# Patient Record
Sex: Female | Born: 1935 | Race: White | Hispanic: No | Smoking: Never smoker
Health system: Southern US, Community
[De-identification: ages and names within clinical notes are randomized; demographics above are authoritative.]

## PROBLEM LIST (undated history)

## (undated) DIAGNOSIS — I4891 Unspecified atrial fibrillation: Secondary | ICD-10-CM

## (undated) DIAGNOSIS — I10 Essential (primary) hypertension: Secondary | ICD-10-CM

## (undated) DIAGNOSIS — M1990 Unspecified osteoarthritis, unspecified site: Secondary | ICD-10-CM

## (undated) DIAGNOSIS — I6529 Occlusion and stenosis of unspecified carotid artery: Secondary | ICD-10-CM

## (undated) DIAGNOSIS — I351 Nonrheumatic aortic (valve) insufficiency: Secondary | ICD-10-CM

## (undated) DIAGNOSIS — F0390 Unspecified dementia without behavioral disturbance: Secondary | ICD-10-CM

## (undated) DIAGNOSIS — E785 Hyperlipidemia, unspecified: Secondary | ICD-10-CM

## (undated) DIAGNOSIS — I639 Cerebral infarction, unspecified: Secondary | ICD-10-CM

## (undated) HISTORY — PX: PACEMAKER INSERTION: SHX728

---

## 2000-06-20 ENCOUNTER — Encounter: Admission: RE | Admit: 2000-06-20 | Discharge: 2000-06-20 | Payer: Self-pay | Admitting: Internal Medicine

## 2000-06-20 ENCOUNTER — Encounter: Payer: Self-pay | Admitting: Internal Medicine

## 2001-04-05 ENCOUNTER — Encounter (HOSPITAL_BASED_OUTPATIENT_CLINIC_OR_DEPARTMENT_OTHER): Payer: Self-pay | Admitting: Internal Medicine

## 2001-04-05 ENCOUNTER — Encounter: Admission: RE | Admit: 2001-04-05 | Discharge: 2001-04-05 | Payer: Self-pay | Admitting: Internal Medicine

## 2001-07-02 ENCOUNTER — Encounter (HOSPITAL_BASED_OUTPATIENT_CLINIC_OR_DEPARTMENT_OTHER): Payer: Self-pay | Admitting: Internal Medicine

## 2001-07-02 ENCOUNTER — Encounter: Admission: RE | Admit: 2001-07-02 | Discharge: 2001-07-02 | Payer: Self-pay | Admitting: Internal Medicine

## 2012-08-01 ENCOUNTER — Emergency Department (HOSPITAL_BASED_OUTPATIENT_CLINIC_OR_DEPARTMENT_OTHER): Payer: Medicare Other

## 2012-08-01 ENCOUNTER — Encounter (HOSPITAL_BASED_OUTPATIENT_CLINIC_OR_DEPARTMENT_OTHER): Payer: Self-pay

## 2012-08-01 ENCOUNTER — Inpatient Hospital Stay (HOSPITAL_BASED_OUTPATIENT_CLINIC_OR_DEPARTMENT_OTHER)
Admission: EM | Admit: 2012-08-01 | Discharge: 2012-08-09 | DRG: 308 | Disposition: A | Discharge status: Skilled Nursing Facility | Payer: Medicare Other | Attending: Internal Medicine | Admitting: Internal Medicine

## 2012-08-01 DIAGNOSIS — J9600 Acute respiratory failure, unspecified whether with hypoxia or hypercapnia: Secondary | ICD-10-CM | POA: Diagnosis present

## 2012-08-01 DIAGNOSIS — E876 Hypokalemia: Secondary | ICD-10-CM

## 2012-08-01 DIAGNOSIS — Z8673 Personal history of transient ischemic attack (TIA), and cerebral infarction without residual deficits: Secondary | ICD-10-CM

## 2012-08-01 DIAGNOSIS — J811 Chronic pulmonary edema: Secondary | ICD-10-CM

## 2012-08-01 DIAGNOSIS — R040 Epistaxis: Secondary | ICD-10-CM

## 2012-08-01 DIAGNOSIS — I4891 Unspecified atrial fibrillation: Principal | ICD-10-CM

## 2012-08-01 DIAGNOSIS — Z66 Do not resuscitate: Secondary | ICD-10-CM | POA: Diagnosis present

## 2012-08-01 DIAGNOSIS — F0390 Unspecified dementia without behavioral disturbance: Secondary | ICD-10-CM | POA: Diagnosis present

## 2012-08-01 DIAGNOSIS — Z950 Presence of cardiac pacemaker: Secondary | ICD-10-CM

## 2012-08-01 DIAGNOSIS — I10 Essential (primary) hypertension: Secondary | ICD-10-CM | POA: Diagnosis present

## 2012-08-01 DIAGNOSIS — Z515 Encounter for palliative care: Secondary | ICD-10-CM

## 2012-08-01 DIAGNOSIS — J189 Pneumonia, unspecified organism: Secondary | ICD-10-CM

## 2012-08-01 DIAGNOSIS — E8779 Other fluid overload: Secondary | ICD-10-CM | POA: Diagnosis present

## 2012-08-01 HISTORY — DX: Essential (primary) hypertension: I10

## 2012-08-01 HISTORY — DX: Cerebral infarction, unspecified: I63.9

## 2012-08-01 HISTORY — DX: Unspecified atrial fibrillation: I48.91

## 2012-08-01 LAB — CBC
HCT: 36.9 % (ref 36.0–46.0)
Hemoglobin: 12.8 g/dL (ref 12.0–15.0)
MCH: 31.6 pg (ref 26.0–34.0)
MCHC: 34.7 g/dL (ref 30.0–36.0)
MCV: 91.1 fL (ref 78.0–100.0)
Platelets: 280 10*3/uL (ref 150–400)
RBC: 4.05 MIL/uL (ref 3.87–5.11)
RDW: 14.4 % (ref 11.5–15.5)
WBC: 15.2 10*3/uL — ABNORMAL HIGH (ref 4.0–10.5)

## 2012-08-01 LAB — BASIC METABOLIC PANEL
BUN: 59 mg/dL — ABNORMAL HIGH (ref 6–23)
CO2: 19 mEq/L (ref 19–32)
Calcium: 10.1 mg/dL (ref 8.4–10.5)
Chloride: 102 mEq/L (ref 96–112)
Creatinine, Ser: 1.1 mg/dL (ref 0.50–1.10)
GFR calc Af Amer: 46 mL/min — ABNORMAL LOW (ref >90)
GFR calc non Af Amer: 40 mL/min — ABNORMAL LOW (ref >90)
Glucose, Bld: 174 mg/dL — ABNORMAL HIGH (ref 70–99)
Potassium: 4 mEq/L (ref 3.5–5.1)
Sodium: 139 mEq/L (ref 135–145)

## 2012-08-01 LAB — TROPONIN I: Troponin I: <0.3 ng/mL (ref <0.30)

## 2012-08-01 MED ORDER — DILTIAZEM HCL 100 MG IV SOLR
INTRAVENOUS | Status: AC
  Filled 2012-08-01: qty 100

## 2012-08-01 MED ORDER — SODIUM CHLORIDE 0.9 % IV SOLN
Freq: Once | INTRAVENOUS | Status: AC
  Administered 2012-08-01: 22:00:00 via INTRAVENOUS

## 2012-08-01 MED ORDER — DILTIAZEM HCL 25 MG/5ML IV SOLN
10.0000 mg | Freq: Once | INTRAVENOUS | Status: AC
  Administered 2012-08-01: 10 mg via INTRAVENOUS

## 2012-08-01 MED ORDER — DILTIAZEM HCL 100 MG IV SOLR
5.0000 mg/h | Freq: Once | INTRAVENOUS | Status: AC
  Administered 2012-08-01: 5 mg/h via INTRAVENOUS

## 2012-08-01 NOTE — Procedures (Signed)
Arrived to triage with a 79yo female with complaint of SOB. Patient was tachy with RR of 44. Patient O2 saturations of 77. Patient was then transported immediately to another room and placed on a 50% venti-mask with oxygen saturations of 90-93%. Patient BD ess. Clear per auscultation. Patient color improved. Will continue to monitor and treat according.

## 2012-08-01 NOTE — ED Notes (Addendum)
Grandson reports pt has been weak, SOB-was seen by Cards yesterday-dx with Afib with elevated HR

## 2012-08-01 NOTE — ED Notes (Signed)
MD at bedside. 

## 2012-08-01 NOTE — ED Provider Notes (Addendum)
History     CSN: [id number]  Arrival date & time 08/01/12  2103   First MD Initiated Contact with Patient 08/01/12 2123      Chief Complaint  Patient presents with  . Weakness    (Consider location/radiation/quality/duration/timing/severity/associated sxs/prior treatment) Patient is a 79 y.o. female presenting with weakness.  Weakness   Level 5 caveat due to urgent need for intervention Pt with history of afib diagnosed recently at cardiology office had her metoprolol increased however today she began to have general weakness and SOB. Denies any chest pain, brought to the ED by family members where she was found to be hypoxic in the 70s and tachycardic in the 140s.   Past Medical History  Diagnosis Date  . Stroke   . A-fib     Past Surgical History  Procedure Laterality Date  . Pacemaker insertion      No family history on file.  History  Substance Use Topics  . Smoking status: Never Smoker   . Smokeless tobacco: Not on file  . Alcohol Use: No    OB History   Grav Para Term Preterm Abortions TAB SAB Ect Mult Living                  Review of Systems  Neurological: Positive for weakness.   Unable to assess due to urgent need for intervention  Allergies  Review of patient's allergies indicates no known allergies.  Home Medications   Current Outpatient Rx  Name  Route  Sig  Dispense  Refill  . amLODipine (NORVASC) 5 MG tablet   Oral   Take 5 mg by mouth daily.         . clonazePAM (KLONOPIN) 0.5 MG tablet   Oral   Take 0.5 mg by mouth 2 (two) times daily as needed for anxiety.         . metoprolol succinate (TOPROL XL) 50 MG 24 hr tablet   Oral   Take 50 mg by mouth 2 (two) times daily. Take with or immediately following a meal.           BP 113/66  Pulse 144  Resp 18  SpO2 91%  Physical Exam  Nursing note and vitals reviewed. Constitutional: She is oriented to person, place, and time. She appears well-developed and well-nourished.   HENT:  Head: Normocephalic and atraumatic.  Eyes: EOM are normal. Pupils are equal, round, and reactive to light.  Neck: Normal range of motion. Neck supple.  Cardiovascular: Normal heart sounds and intact distal pulses.  An irregular rhythm present. Tachycardia present.   Pulmonary/Chest: Tachypnea noted.  Abdominal: Bowel sounds are normal. She exhibits no distension. There is no tenderness.  Musculoskeletal: Normal range of motion. She exhibits no edema and no tenderness.  Neurological: She is alert and oriented to person, place, and time. She has normal strength. No cranial nerve deficit or sensory deficit.  Skin: Skin is warm and dry. No rash noted.  Psychiatric: She has a normal mood and affect.    ED Course  Procedures (including critical care time)  CRITICAL CARE Performed by: Pollyann Savoy. Total critical care time: 45 Critical care time was exclusive of separately billable procedures and treating other patients. Critical care was necessary to treat or prevent imminent or life-threatening deterioration. Critical care was time spent personally by me on the following activities: development of treatment plan with patient and/or surrogate as well as nursing, discussions with consultants, evaluation of patient's response to treatment, examination of  patient, obtaining history from patient or surrogate, ordering and performing treatments and interventions, ordering and review of laboratory studies, ordering and review of radiographic studies, pulse oximetry and re-evaluation of patient's condition.   Labs Reviewed  CBC - Abnormal; Notable for the following:    WBC 15.2 (*)    All other components within normal limits  BASIC METABOLIC PANEL - Abnormal; Notable for the following:    Glucose, Bld 174 (*)    BUN 59 (*)    GFR calc non Af Amer 40 (*)    GFR calc Af Amer 46 (*)    All other components within normal limits  TROPONIN I   Dg Chest Port 1 View  08/01/2012    *RADIOLOGY REPORT*  Clinical Data: Chest pain and shortness of breath.  PORTABLE CHEST - 1 VIEW  Comparison: None.  Findings: The lungs are well-aerated.  Vascular congestion is noted, with diffusely increased interstitial markings and left basilar airspace opacity.  A small left pleural effusion is suspected.  Findings are concerning for pulmonary edema.  There is no evidence of pneumothorax.  The cardiomediastinal silhouette is borderline normal in size. Calcification is noted in the aortic arch.  A pacemaker is seen overlying the left chest wall, with leads ending overlying the right atrium and right ventricle.  No acute osseous abnormalities are seen.  IMPRESSION: Vascular congestion, with diffusely increased interstitial markings and left basilar airspace opacity.  Suspect small left pleural effusion.  Findings concerning for pulmonary edema.   Original Report Authenticated By: Tonia Ghent, M.D.     1. Atrial fibrillation with RVR   2. Pulmonary edema       MDM   Date: 08/01/2012  Rate: 145  Rhythm: atrial fibrillation and with RVR  QRS Axis: left  Intervals: normal  ST/T Wave abnormalities: nonspecific ST/T changes  Conduction Disutrbances:none  Narrative Interpretation:   Old EKG Reviewed: none available  Pt BP is good, started on cardizem after bolus with minimal improvement. Drip was titrated and additional bolus given with good response, now HR in the 100s, still in afib. CXR shows some pulmonary edema, likely related to rate. SpO2 is 90-92% on 50% NRB. She is DNR/DNI per grandson at bedside who is POA. Her cardiologist is at [facility] however their hospital is full and they are not accepting transfers tonight. Discussed with Dr. Julian Reil on call for Hospitalist at [facility] who will accept her for transfer and further evaluation.        Emalina Dubreuil B. Bernette Mayers, MD 08/01/12 2339  Bonnita Levan. Bernette Mayers, MD 08/01/12 2351

## 2012-08-01 NOTE — ED Notes (Signed)
Pt found to have low sats, irregular HR and elevated HR-plae-dry to touch-taken to tx room 14-pt was able to stand was lifted and placed on stretcher by staff x 2

## 2012-08-01 NOTE — ED Notes (Signed)
Grandson reports pt with increased weakness and mild confusion today. Pt has oxygen sat low 80's on arrival to ED. Pt in AFib on monitor and placed on oxygen by RT.

## 2012-08-02 ENCOUNTER — Inpatient Hospital Stay (HOSPITAL_COMMUNITY): Payer: Medicare Other

## 2012-08-02 ENCOUNTER — Encounter (HOSPITAL_BASED_OUTPATIENT_CLINIC_OR_DEPARTMENT_OTHER): Payer: Self-pay | Admitting: Emergency Medicine

## 2012-08-02 DIAGNOSIS — J811 Chronic pulmonary edema: Secondary | ICD-10-CM | POA: Diagnosis present

## 2012-08-02 DIAGNOSIS — J189 Pneumonia, unspecified organism: Secondary | ICD-10-CM | POA: Diagnosis present

## 2012-08-02 DIAGNOSIS — I059 Rheumatic mitral valve disease, unspecified: Secondary | ICD-10-CM

## 2012-08-02 DIAGNOSIS — I4891 Unspecified atrial fibrillation: Secondary | ICD-10-CM | POA: Diagnosis present

## 2012-08-02 LAB — BLOOD GAS, ARTERIAL
Acid-base deficit: 4.2 mmol/L — ABNORMAL HIGH (ref 0.0–2.0)
Drawn by: 34767
FIO2: 1 %
TCO2: 19.6 mmol/L (ref 0–100)
pCO2 arterial: 25.9 mmHg — ABNORMAL LOW (ref 35.0–45.0)
pH, Arterial: 7.474 — ABNORMAL HIGH (ref 7.350–7.450)
pO2, Arterial: 97.6 mmHg (ref 80.0–100.0)

## 2012-08-02 LAB — MRSA PCR SCREENING: MRSA by PCR: NEGATIVE

## 2012-08-02 LAB — CBC WITH DIFFERENTIAL/PLATELET
Basophils Absolute: 0 10*3/uL (ref 0.0–0.1)
Basophils Relative: 0 % (ref 0–1)
HCT: 34.9 % — ABNORMAL LOW (ref 36.0–46.0)
Lymphocytes Relative: 12 % (ref 12–46)
MCHC: 34.7 g/dL (ref 30.0–36.0)
Monocytes Absolute: 1 10*3/uL (ref 0.1–1.0)
Neutro Abs: 11.3 10*3/uL — ABNORMAL HIGH (ref 1.7–7.7)
Neutrophils Relative %: 81 % — ABNORMAL HIGH (ref 43–77)
Platelets: 245 10*3/uL (ref 150–400)
RDW: 14.8 % (ref 11.5–15.5)
WBC: 14.1 10*3/uL — ABNORMAL HIGH (ref 4.0–10.5)

## 2012-08-02 LAB — COMPREHENSIVE METABOLIC PANEL
ALT: 81 U/L — ABNORMAL HIGH (ref 0–35)
Albumin: 3.3 g/dL — ABNORMAL LOW (ref 3.5–5.2)
Alkaline Phosphatase: 138 U/L — ABNORMAL HIGH (ref 39–117)
Chloride: 103 mEq/L (ref 96–112)
Glucose, Bld: 156 mg/dL — ABNORMAL HIGH (ref 70–99)
Potassium: 3.9 mEq/L (ref 3.5–5.1)
Sodium: 138 mEq/L (ref 135–145)
Total Bilirubin: 1.3 mg/dL — ABNORMAL HIGH (ref 0.3–1.2)
Total Protein: 7.2 g/dL (ref 6.0–8.3)

## 2012-08-02 LAB — URINALYSIS, ROUTINE W REFLEX MICROSCOPIC
Bilirubin Urine: NEGATIVE
Glucose, UA: NEGATIVE mg/dL
Hgb urine dipstick: NEGATIVE
Ketones, ur: 15 mg/dL — AB
Nitrite: NEGATIVE
Specific Gravity, Urine: 1.025 (ref 1.005–1.030)
pH: 5.5 (ref 5.0–8.0)

## 2012-08-02 LAB — D-DIMER, QUANTITATIVE: D-Dimer, Quant: 2.83 ug/mL-FEU — ABNORMAL HIGH (ref 0.00–0.48)

## 2012-08-02 LAB — URINE MICROSCOPIC-ADD ON

## 2012-08-02 LAB — TROPONIN I
Troponin I: <0.3 ng/mL (ref <0.30)
Troponin I: <0.3 ng/mL (ref <0.30)

## 2012-08-02 LAB — STREP PNEUMONIAE URINARY ANTIGEN: Strep Pneumo Urinary Antigen: NEGATIVE

## 2012-08-02 MED ORDER — DEXTROSE 5 % IV SOLN
500.0000 mg | Freq: Every day | INTRAVENOUS | Status: DC
  Administered 2012-08-02 – 2012-08-04 (×3): 500 mg via INTRAVENOUS
  Filled 2012-08-02 (×3): qty 500

## 2012-08-02 MED ORDER — DEXTROSE 5 % IV SOLN
1.0000 g | INTRAVENOUS | Status: DC
  Administered 2012-08-02 – 2012-08-07 (×6): 1 g via INTRAVENOUS
  Filled 2012-08-02 (×7): qty 10

## 2012-08-02 MED ORDER — DILTIAZEM HCL 100 MG IV SOLR
INTRAVENOUS | Status: AC
  Filled 2012-08-02: qty 100

## 2012-08-02 MED ORDER — ASPIRIN EC 81 MG PO TBEC
81.0000 mg | DELAYED_RELEASE_TABLET | Freq: Every day | ORAL | Status: DC
  Administered 2012-08-03 – 2012-08-08 (×6): 81 mg via ORAL
  Filled 2012-08-02 (×8): qty 1

## 2012-08-02 MED ORDER — DILTIAZEM HCL 100 MG IV SOLR
5.0000 mg/h | INTRAVENOUS | Status: DC
  Administered 2012-08-02: 15 mg/h via INTRAVENOUS
  Administered 2012-08-02: 10 mg/h via INTRAVENOUS
  Administered 2012-08-02 – 2012-08-05 (×10): 15 mg/h via INTRAVENOUS
  Filled 2012-08-02 (×2): qty 100

## 2012-08-02 MED ORDER — ENOXAPARIN SODIUM 60 MG/0.6ML ~~LOC~~ SOLN
50.0000 mg | SUBCUTANEOUS | Status: DC
  Administered 2012-08-03 – 2012-08-05 (×3): 50 mg via SUBCUTANEOUS
  Filled 2012-08-02 (×3): qty 0.6

## 2012-08-02 MED ORDER — ENOXAPARIN SODIUM 30 MG/0.3ML ~~LOC~~ SOLN
20.0000 mg | SUBCUTANEOUS | Status: AC
  Administered 2012-08-02: 20 mg via SUBCUTANEOUS
  Filled 2012-08-02: qty 0.2

## 2012-08-02 MED ORDER — SODIUM CHLORIDE 0.9 % IJ SOLN: 3.0000 mL | INTRAMUSCULAR | Status: DC | PRN

## 2012-08-02 MED ORDER — PANTOPRAZOLE SODIUM 40 MG PO TBEC: 40.0000 mg | DELAYED_RELEASE_TABLET | Freq: Every day | ORAL | Status: DC

## 2012-08-02 MED ORDER — VANCOMYCIN HCL 500 MG IV SOLR: 500.0000 mg | INTRAVENOUS | Status: DC

## 2012-08-02 MED ORDER — ASPIRIN 325 MG PO TABS
325.0000 mg | ORAL_TABLET | Freq: Every day | ORAL | Status: DC
  Administered 2012-08-02: 325 mg via ORAL
  Filled 2012-08-02: qty 1

## 2012-08-02 MED ORDER — VANCOMYCIN HCL IN DEXTROSE 1-5 GM/200ML-% IV SOLN
1000.0000 mg | Freq: Once | INTRAVENOUS | Status: AC
  Administered 2012-08-02: 1000 mg via INTRAVENOUS
  Filled 2012-08-02: qty 200

## 2012-08-02 MED ORDER — ENOXAPARIN SODIUM 30 MG/0.3ML ~~LOC~~ SOLN
30.0000 mg | Freq: Every day | SUBCUTANEOUS | Status: DC
  Administered 2012-08-02: 30 mg via SUBCUTANEOUS
  Filled 2012-08-02: qty 0.3

## 2012-08-02 MED ORDER — SODIUM CHLORIDE 0.9 % IV SOLN: 250.0000 mL | INTRAVENOUS | Status: DC | PRN

## 2012-08-02 MED ORDER — CLONAZEPAM 0.5 MG PO TABS
0.2500 mg | ORAL_TABLET | Freq: Two times a day (BID) | ORAL | Status: DC | PRN
  Administered 2012-08-02 – 2012-08-05 (×4): 0.25 mg via ORAL
  Filled 2012-08-02 (×4): qty 1

## 2012-08-02 MED ORDER — SODIUM CHLORIDE 0.9 % IJ SOLN: 3.0000 mL | Freq: Two times a day (BID) | INTRAMUSCULAR | Status: DC

## 2012-08-02 MED ORDER — SODIUM CHLORIDE 0.9 % IV SOLN: 250.0000 mL | INTRAVENOUS | Status: DC

## 2012-08-02 MED ORDER — SODIUM CHLORIDE 0.9 % IV SOLN
250.0000 mL | INTRAVENOUS | Status: DC
  Administered 2012-08-02 (×2): 50 mL via INTRAVENOUS

## 2012-08-02 NOTE — Progress Notes (Signed)
Echocardiogram 2D Echocardiogram with Bubble Study has been performed.  Trinika Cortese 08/02/2012, 10:18 AM

## 2012-08-02 NOTE — Progress Notes (Signed)
ANTICOAGULATION CONSULT NOTE - Initial Consult  Pharmacy Consult for lovenox Indication: atrial fibrillation  No Known Allergies  Patient Measurements: Height: 5' (152.4 cm) Weight: 108 lb 11 oz (49.3 kg) IBW/kg (Calculated) : 45.5   Vital Signs: Temp: 98 F (36.7 C) (05/30 0700) Temp src: Oral (05/30 0700) BP: 115/86 mmHg (05/30 0700) Pulse Rate: 82 (05/30 0700)  Labs:  Recent Labs  08/01/12 2120 08/02/12 0552  HGB 12.8 12.1  HCT 36.9 34.9*  PLT 280 245  CREATININE 1.10 1.02  TROPONINI <0.30 <0.30    Estimated Creatinine Clearance: 21.6 ml/min (by C-G formula based on Cr of 1.02).   Medical History: Past Medical History  Diagnosis Date  . Stroke   . A-fib   . Hypertension     Assessment: Patient is a 79 y.o F with new onset Afib.  To start lovenox full dose for new onset Afib.  Patient received lovenox 30mg  dose this morning at ~10AM.  Scr 1 (crcl 28).  Goal of Therapy:  Anti-Xa level 0.6-1.2 units/ml 4hrs after LMWH dose given Monitor platelets by anticoagulation protocol: Yes   Plan:  1) lovenox 50mg  SQ q24h (will give 20mg  SQ x1 now to get 50mg  for today)  Tasha Jindra P 08/02/2012,11:16 AM

## 2012-08-02 NOTE — Progress Notes (Signed)
Concerned that the patient has not had the urge to void. Have bladder scanned the patient and it shows that she has less then 100 cc's. Have notified Dr. Donette Larry and have received new orders.

## 2012-08-02 NOTE — Progress Notes (Signed)
UR COMPLETED  

## 2012-08-02 NOTE — Progress Notes (Signed)
ANTIBIOTIC CONSULT NOTE - INITIAL  Pharmacy Consult for vancomycin Indication: rule out pneumonia  No Known Allergies  Patient Measurements: Height: 5' (152.4 cm) Weight: 108 lb 11 oz (49.3 kg) IBW/kg (Calculated) : 45.5  Vital Signs: Temp: 97.8 F (36.6 C) (05/30 0315) Temp src: Axillary (05/30 0315) BP: 108/53 mmHg (05/30 0315) Pulse Rate: 75 (05/30 0315) Intake/Output from previous day: 05/29 0701 - 05/30 0700 In: 37.5 [I.V.:37.5] Out: -  Intake/Output from this shift: Total I/O In: 37.5 [I.V.:37.5] Out: -   Labs:  Recent Labs  08/01/12 2120  WBC 15.2*  HGB 12.8  PLT 280  CREATININE 1.10   Estimated Creatinine Clearance: 20 ml/min (by C-G formula based on Cr of 1.1). No results found for this basename: VANCOTROUGH, Leodis Binet, VANCORANDOM, GENTTROUGH, GENTPEAK, GENTRANDOM, TOBRATROUGH, TOBRAPEAK, TOBRARND, AMIKACINPEAK, AMIKACINTROU, AMIKACIN,  in the last 72 hours   Microbiology: Recent Results (from the past 720 hour(s))  MRSA PCR SCREENING     Status: None   Collection Time    08/02/12  2:24 AM      Result Value Range Status   MRSA by PCR NEGATIVE  NEGATIVE Final   Comment:            The GeneXpert MRSA Assay (FDA     approved for NASAL specimens     only), is one component of a     comprehensive MRSA colonization     surveillance program. It is not     intended to diagnose MRSA     infection nor to guide or     monitor treatment for     MRSA infections.    Medical History: Past Medical History  Diagnosis Date  . Stroke   . A-fib   . Hypertension     Medications:  Scheduled:  . aspirin  325 mg Oral Daily  . azithromycin  500 mg Intravenous Q24H  . cefTRIAXone (ROCEPHIN)  IV  1 g Intravenous Q24H  . diltiazem      . diltiazem      . enoxaparin (LOVENOX) injection  40 mg Subcutaneous Q24H  . sodium chloride  3 mL Intravenous Q12H   Assessment: 79 yo female who presented with weakness and shortness of breath. Pharmacy to manage  vancomycin for possible pneumonia. Patient is also to receive ceftriaxone and azithromycin.   Goal of Therapy:  Vancomycin trough level 15-20 mcg/ml  Plan:  1. Vancomycin 1gm IV x 1, then 500mg  IV Q24H.   Emeline Gins 08/02/2012,5:03 AM

## 2012-08-02 NOTE — Progress Notes (Addendum)
[facility] Progress Note [place] TEAM 1 - Stepdown/ICU TEAM   Regina Burch OZH:[id number] DOB: 1911-04-06 DOA: 08/01/2012 PCP: Caffie Damme, MD  Brief narrative: 79 y.o. female w/ past medical history of Stroke; A-fib; and Hypertension who presented with 2 days of not feeling well and increased work of breathing. Grandson checked her pulse and it was racing. She was seen by her cardiologist and was diagnosed with A.fib and was started on metoprolol. She has hx of pacemaker. She had hx of TIA/CVA in remote past and ws started on coumadin but had been off this for the past few years. She had been on aspirin 81 mg but this was stopped due to epistaxis. Denied any fever or chills, no cough.   She was taken to [facility] and was found to be in a.fib with RVR with HR in 140's she was also noted to be hypoxic. Diltiazem was given with good results. Initial CXR showed pulmonary edema with questionable infiltrate. The repeat imaging showed improvement of edema but still possible infiltrate. Patient was transferred to [facility] stepdown.   Assessment/Plan:  Atrial fibrillation with RVR  Acute hypoxic respiratory failure due to:      Pulmonary edema       CAP (community acquired pneumonia)  Elevated D-dimer  S/P pacemaker Hx scant - pacer noted on CXR  Hx of HTN  Hx of CVA  Hyperglycemia  Code Status: NO CODE Family Communication: no family present at time of exam Disposition Plan: SDU  Consultants: none  Procedures: TTE - 5/30 - pending   Antibiotics: Rocephin 5/29 >> Azithro 5/30 >> Vanc 5/29  DVT prophylaxis: lovenox  HPI/Subjective: Pt seen for f/u visit  Objective: Blood pressure 115/86, pulse 82, temperature 98 F (36.7 C), temperature source Oral, resp. rate 15, height 5' (1.524 m), weight 49.3 kg (108 lb 11 oz), SpO2 82.00%.  Intake/Output Summary (Last 24 hours) at 08/02/12 1033 Last data filed at 08/02/12 0600  Gross per 24 hour  Intake  187.5 ml  Output     650 ml  Net -462.5 ml   Exam: F/U exam completed  Data Reviewed: Basic Metabolic Panel:  Recent Labs Lab 08/01/12 2120 08/02/12 0552  NA 139 138  K 4.0 3.9  CL 102 103  CO2 19 19  GLUCOSE 174* 156*  BUN 59* 66*  CREATININE 1.10 1.02  CALCIUM 10.1 9.2   Liver Function Tests:  Recent Labs Lab 08/02/12 0552  AST 54*  ALT 81*  ALKPHOS 138*  BILITOT 1.3*  PROT 7.2  ALBUMIN 3.3*   CBC:  Recent Labs Lab 08/01/12 2120 08/02/12 0552  WBC 15.2* 14.1*  NEUTROABS  --  11.3*  HGB 12.8 12.1  HCT 36.9 34.9*  MCV 91.1 90.6  PLT 280 245   Cardiac Enzymes:  Recent Labs Lab 08/01/12 2120 08/02/12 0552  TROPONINI <0.30 <0.30   CBG: No results found for this basename: GLUCAP,  in the last 168 hours  Recent Results (from the past 240 hour(s))  MRSA PCR SCREENING     Status: None   Collection Time    08/02/12  2:24 AM      Result Value Range Status   MRSA by PCR NEGATIVE  NEGATIVE Final   Comment:            The GeneXpert MRSA Assay (FDA     approved for NASAL specimens     only), is one component of a     comprehensive MRSA colonization  surveillance program. It is not     intended to diagnose MRSA     infection nor to guide or     monitor treatment for     MRSA infections.     Studies:  Recent x-ray studies have been reviewed in detail by the Attending Physician  Scheduled Meds:  Scheduled Meds: . aspirin  325 mg Oral Daily  . azithromycin  500 mg Intravenous Daily  . cefTRIAXone (ROCEPHIN)  IV  1 g Intravenous Q24H  . diltiazem      . enoxaparin (LOVENOX) injection  30 mg Subcutaneous Daily  . pantoprazole  40 mg Oral Q1200  . sodium chloride  3 mL Intravenous Q12H  . [START ON 08/03/2012] vancomycin  500 mg Intravenous Q24H   Continuous Infusions: . diltiazem (CARDIZEM) infusion 15 mg/hr (08/02/12 1000)    Time spent on care of this patient: 25+mins   [facility] T  [facility] Office  [phone] Pager - Text Page per  Loretha Stapler as per below:  On-Call/Text Page:      [email or web address]      password [email or web address]  If 7PM-7AM, please contact night-coverage [email or web address] Password [email or web address] 08/02/2012, 10:33 AM   LOS: 1 day

## 2012-08-02 NOTE — ED Notes (Signed)
[facility] transporting pt now. 

## 2012-08-02 NOTE — Progress Notes (Signed)
Nutrition Brief Note  Patient identified on the Malnutrition Screening Tool (MST) Report for unsure of any weight loss. Unsure of usual weight and patient unable to provide any information due to confusion. Per RN, patient is eating very well, ate almost all of breakfast today.  Body mass index is 21.23 kg/(m^2). Patient meets criteria for normal weight based on current BMI.   Current diet order is heart healthy, patient is consuming approximately 75% of meals at this time. Labs and medications reviewed.   No nutrition interventions warranted at this time. If nutrition issues arise, please consult RD.   Joaquin Courts, RD, LDN, CNSC Pager [phone] After Hours Pager [phone]

## 2012-08-02 NOTE — H&P (Signed)
PCP:  Caffie Damme, MD [facility] in [facility] [place].  Chief Complaint:  Lethargy  HPI: Regina Burch is a 79 y.o. female   has a past medical history of Stroke; A-fib; and Hypertension.   Presented with  2 days of not feeling well and increased work of breathing. Grand-son checked her pulse and it was racing. She was seen yesterday by her cardiologist and was diagnosed with A.fib and was started on metoprolol. She has hx of pacemaker. She have had hx of TIA/CVA in remote past and ws started on coumadin but have been off this for the past few years. She has been on aspirin 81 mg but this was stopped due to epistaxis.  Denies any fever or chills, no cough.  She was brought to [facility] and was found to be in a.fib with RVR with HR in 140's she was also noted to be hypoxic. Diltiazem was given with good results. Initial CXR showed pulmonary edema with questionable infiltrate. The  repeat imaging showing improvement of edema but still possible infiltrate. Patient is now transferred to [facility] stepdown. Hospitalist was called to admit in transfer.   Review of Systems:    Pertinent positives include: fatigue, shortness of breath at rest.  Constitutional:  No weight loss, night sweats, Fevers, chills, weight loss  HEENT:  No headaches, Difficulty swallowing,Tooth/dental problems,Sore throat,  No sneezing, itching, ear ache, nasal congestion, post nasal drip,  Cardio-vascular:  No chest pain, Orthopnea, PND, anasarca, dizziness, palpitations.no Bilateral lower extremity swelling  GI:  No heartburn, indigestion, abdominal pain, nausea, vomiting, diarrhea, change in bowel habits, loss of appetite, melena, blood in stool, hematemesis Resp:  no  No dyspnea on exertion, No excess mucus, no productive cough, No non-productive cough, No coughing up of blood.No change in color of mucus.No wheezing. Skin:  no rash or lesions. No jaundice GU:  no dysuria, change in color of urine,  no urgency or frequency. No straining to urinate.  No flank pain.  Musculoskeletal:  No joint pain or no joint swelling. No decreased range of motion. No back pain.  Psych:  No change in mood or affect. No depression or anxiety. No memory loss.  Neuro: no localizing neurological complaints, no tingling, no weakness, no double vision, no gait abnormality, no slurred speech, no confusion  Otherwise ROS are negative except for above, 10 systems were reviewed  Past Medical History: Past Medical History  Diagnosis Date  . Stroke   . A-fib   . Hypertension    Past Surgical History  Procedure Laterality Date  . Pacemaker insertion       Medications: Prior to Admission medications   Medication Sig Start Date End Date Taking? Authorizing Provider  amLODipine (NORVASC) 5 MG tablet Take 5 mg by mouth daily.   Yes Historical Provider, MD  clonazePAM (KLONOPIN) 0.5 MG tablet Take 0.25 mg by mouth 2 (two) times daily as needed for anxiety.    Yes Historical Provider, MD  metoprolol succinate (TOPROL XL) 50 MG 24 hr tablet Take 50 mg by mouth 2 (two) times daily. Take with or immediately following a meal.   Yes Historical Provider, MD    Allergies:  No Known Allergies  Social History:  Ambulatory  independently   Lives at home alone   reports that she has never smoked. She does not have any smokeless tobacco history on file. She reports that she does not drink alcohol or use illicit drugs.   Family History: family  history includes Stroke in her mother.    Physical Exam: Patient Vitals for the past 24 hrs:  BP Temp Temp src Pulse Resp SpO2 Height Weight  08/02/12 0315 108/53 mmHg 97.8 F (36.6 C) Axillary 75 37 94 % - -  08/02/12 0245 - - - - - 95 % - -  08/02/12 0239 140/84 mmHg 97.4 F (36.3 C) Axillary - 30 88 % 5' (1.524 m) 49.3 kg (108 lb 11 oz)  08/02/12 0053 128/75 mmHg - - - 18 91 % - -  08/01/12 2345 128/90 mmHg - - - 18 93 % - -  08/01/12 2302 113/66 mmHg - - - 18 91  % - -  08/01/12 2234 119/73 mmHg - - - 18 91 % - -  08/01/12 2220 123/85 mmHg - - - 18 91 % - -  08/01/12 2211 110/75 mmHg - - - 18 91 % - -  08/01/12 2149 108/85 mmHg - - - 20 91 % - -  08/01/12 2130 110/70 mmHg - - - 18 93 % - -  08/01/12 2112 131/94 mmHg - - 144 24 84 % - -    1. General:  in No Acute distress 2. Psychological: Alert and  Oriented 3. Head/ENT:    Dry Mucous Membranes                          Head Non traumatic, neck supple                          Normal  Dentition 4. SKIN:   decreased Skin turgor,  Skin clean Dry and intact no rash 5. Heart: irregular rate and rhythm no Murmur, Rub or gallop 6. Lungs:  no wheezes occasional crackles   7. Abdomen: Soft, non-tender, Non distended 8. Lower extremities: no clubbing, cyanosis, or edema 9. Neurologically Grossly intact, moving all 4 extremities equally 10. MSK: Normal range of motion  body mass index is 21.23 kg/(m^2).   Labs on Admission:   Recent Labs  08/01/12 2120  NA 139  K 4.0  CL 102  CO2 19  GLUCOSE 174*  BUN 59*  CREATININE 1.10  CALCIUM 10.1   No results found for this basename: AST, ALT, ALKPHOS, BILITOT, PROT, ALBUMIN,  in the last 72 hours No results found for this basename: LIPASE, AMYLASE,  in the last 72 hours  Recent Labs  08/01/12 2120  WBC 15.2*  HGB 12.8  HCT 36.9  MCV 91.1  PLT 280    Recent Labs  08/01/12 2120  TROPONINI <0.30   No results found for this basename: TSH, T4TOTAL, FREET3, T3FREE, THYROIDAB,  in the last 72 hours No results found for this basename: VITAMINB12, FOLATE, FERRITIN, TIBC, IRON, RETICCTPCT,  in the last 72 hours No results found for this basename: HGBA1C    Estimated Creatinine Clearance: 20 ml/min (by C-G formula based on Cr of 1.1). ABG    Component Value Date/Time   PHART 7.474* 08/02/2012 0335   HCO3 18.8* 08/02/2012 0335   TCO2 19.6 08/02/2012 0335   ACIDBASEDEF 4.2* 08/02/2012 0335   O2SAT 97.4 08/02/2012 0335     No results found for  this basename: DDIMER     Other results:  I have pearsonaly reviewed this: ECG REPORT  Rate 140  Rhythm: A.fib w RVR ST&T Change: St depression in V4 likely due to demand   Cultures: No results found for  this basename: sdes, specrequest, cult, reptstatus       Radiological Exams on Admission: Dg Chest Port 1 View  08/02/2012   *RADIOLOGY REPORT*  Clinical Data: Hypoxia.  PORTABLE CHEST - 1 VIEW  Comparison: Chest radiograph performed 08/01/2012  Findings: The lungs are well-aerated.  Previously noted pulmonary edema has significantly improved, with residual increased interstitial markings seen.  Small bilateral pleural effusions are suspected.  Mild left basilar opacity may reflect edema or possibly underlying pneumonia.  No pneumothorax is identified.  The cardiomediastinal silhouette is borderline normal in size.  A pacemaker is noted overlying the left chest wall, with leads ending overlying the right atrium and right ventricle.  Calcification is noted in the aortic arch.  No acute osseous abnormalities are seen.  IMPRESSION: Significant interval improvement in pulmonary edema, with residual edema seen.  Mild left basilar opacity may reflect edema or possibly underlying pneumonia.  Small bilateral pleural effusions suspected.   Original Report Authenticated By: Tonia Ghent, M.D.   Dg Chest Port 1 View  08/01/2012   *RADIOLOGY REPORT*  Clinical Data: Chest pain and shortness of breath.  PORTABLE CHEST - 1 VIEW  Comparison: None.  Findings: The lungs are well-aerated.  Vascular congestion is noted, with diffusely increased interstitial markings and left basilar airspace opacity.  A small left pleural effusion is suspected.  Findings are concerning for pulmonary edema.  There is no evidence of pneumothorax.  The cardiomediastinal silhouette is borderline normal in size. Calcification is noted in the aortic arch.  A pacemaker is seen overlying the left chest wall, with leads ending overlying  the right atrium and right ventricle.  No acute osseous abnormalities are seen.  IMPRESSION: Vascular congestion, with diffusely increased interstitial markings and left basilar airspace opacity.  Suspect small left pleural effusion.  Findings concerning for pulmonary edema.   Original Report Authenticated By: Tonia Ghent, M.D.    Chart has been reviewed  Assessment/Plan   79 yo F with new onset of A.fib per family here with a.fib with RVR and questionable CXR finding of PNA  Present on Admission:  . CAP (community acquired pneumonia) - admit to stepdown given high oxygen requirement and diltiazem gtt.  initiate IV antibiotics. Obtain lactic acid and pro-calcitonin level.  A. Fib w RVR - continue diltiazem gtt now HR is improving but BP is soft. Will cycle CE and obtain TSH and echo. Possible PNA could have induced a. Fib. Would need discussion with her primary cardiology to see if she would be a good candidate for chronic anticoagulation given her advance age. At this time her cardiologist did not initiate anticoagulation.  Hypoxia severe - most likely due to PNA no complaints of chest pain or leg swelling at this point. Given high O2 requirement will alert CCM about the patient.   Prophylaxis:  Lovenox, Protonix  CODE STATUS:DNR/DNI but if needed BiPAP is ok.  Other plan as per orders.  I have spent a total of  65 min on this admission, time taken to speak to CCM.   Judine Arciniega 08/02/2012, 4:37 AM

## 2012-08-03 ENCOUNTER — Inpatient Hospital Stay (HOSPITAL_COMMUNITY): Payer: Medicare Other

## 2012-08-03 LAB — COMPREHENSIVE METABOLIC PANEL
Albumin: 3.2 g/dL — ABNORMAL LOW (ref 3.5–5.2)
BUN: 68 mg/dL — ABNORMAL HIGH (ref 6–23)
Chloride: 103 mEq/L (ref 96–112)
Creatinine, Ser: 1.09 mg/dL (ref 0.50–1.10)
Total Bilirubin: 0.7 mg/dL (ref 0.3–1.2)

## 2012-08-03 LAB — URINE CULTURE
Colony Count: NO GROWTH
Culture: NO GROWTH
Special Requests: NORMAL

## 2012-08-03 LAB — CBC
MCV: 90.2 fL (ref 78.0–100.0)
Platelets: 258 10*3/uL (ref 150–400)
RBC: 3.86 MIL/uL — ABNORMAL LOW (ref 3.87–5.11)
WBC: 11.7 10*3/uL — ABNORMAL HIGH (ref 4.0–10.5)

## 2012-08-03 LAB — LEGIONELLA ANTIGEN, URINE

## 2012-08-03 LAB — MAGNESIUM: Magnesium: 2.4 mg/dL (ref 1.5–2.5)

## 2012-08-03 LAB — PHOSPHORUS: Phosphorus: 3.1 mg/dL (ref 2.3–4.6)

## 2012-08-03 MED ORDER — LORAZEPAM 2 MG/ML IJ SOLN
0.5000 mg | Freq: Once | INTRAMUSCULAR | Status: AC
  Administered 2012-08-03: 0.5 mg via INTRAVENOUS
  Filled 2012-08-03: qty 1

## 2012-08-03 MED ORDER — SODIUM CHLORIDE 0.9 % IV SOLN
250.0000 mL | INTRAVENOUS | Status: DC
  Administered 2012-08-03: 250 mL via INTRAVENOUS

## 2012-08-03 MED ORDER — METOPROLOL TARTRATE 1 MG/ML IV SOLN
5.0000 mg | Freq: Once | INTRAVENOUS | Status: AC
  Administered 2012-08-03: 5 mg via INTRAVENOUS
  Filled 2012-08-03: qty 5

## 2012-08-03 MED ORDER — DIGOXIN 0.25 MG/ML IJ SOLN
0.1250 mg | Freq: Every day | INTRAMUSCULAR | Status: DC
  Administered 2012-08-04 – 2012-08-05 (×2): 0.125 mg via INTRAVENOUS
  Filled 2012-08-03 (×2): qty 0.5

## 2012-08-03 MED ORDER — POTASSIUM CHLORIDE 10 MEQ/100ML IV SOLN
10.0000 meq | INTRAVENOUS | Status: AC
  Administered 2012-08-03 (×4): 10 meq via INTRAVENOUS
  Filled 2012-08-03: qty 100

## 2012-08-03 MED ORDER — DIGOXIN 0.25 MG/ML IJ SOLN
0.2500 mg | Freq: Once | INTRAMUSCULAR | Status: AC
  Administered 2012-08-03: 0.25 mg via INTRAVENOUS
  Filled 2012-08-03: qty 1

## 2012-08-03 MED ORDER — FUROSEMIDE 10 MG/ML IJ SOLN
40.0000 mg | Freq: Two times a day (BID) | INTRAMUSCULAR | Status: DC
  Administered 2012-08-03 – 2012-08-05 (×4): 40 mg via INTRAVENOUS
  Filled 2012-08-03 (×6): qty 4

## 2012-08-03 MED ORDER — MORPHINE SULFATE 2 MG/ML IJ SOLN
1.0000 mg | INTRAMUSCULAR | Status: DC | PRN
  Administered 2012-08-04: 2 mg via INTRAVENOUS
  Administered 2012-08-06 – 2012-08-07 (×2): 1 mg via INTRAVENOUS
  Administered 2012-08-07: 2 mg via INTRAVENOUS
  Filled 2012-08-03 (×4): qty 1

## 2012-08-03 NOTE — Progress Notes (Signed)
[facility] Progress Note [place] TEAM 1 - Stepdown/ICU TEAM   Regina Burch ZOX:[id number] DOB: 27-Jun-1911 DOA: 08/01/2012 PCP: Caffie Damme, MD  Brief narrative: 79 y.o. female w/ past medical history of Stroke; A-fib; and Hypertension who presented with 2 days of not feeling well and increased work of breathing. Grandson checked her pulse and it was racing. She was seen by her cardiologist and was diagnosed with A.fib and was started on metoprolol. She has hx of pacemaker. She had hx of TIA/CVA in remote past and was started on coumadin but had been off this for the past few years. She had been on aspirin 81 mg but this was stopped due to epistaxis. Denied any fever or chills, no cough.   She was taken to [facility] and was found to be in a.fib with RVR with HR in 140's she was also noted to be hypoxic. Diltiazem was given with good results. Initial CXR showed pulmonary edema with questionable infiltrate. The repeat imaging showed improvement of edema but still possible infiltrate. Patient was transferred to [facility] stepdown.   Assessment/Plan:  Atrial fibrillation with RVR Persisting despite high dose cardizem gtt - family does not wish to consider more aggressive approach, and I agree - will add digoxin and follow   Acute hypoxic respiratory failure due to:      Pulmonary edema Was hoping that edema was primarily rate related phenomenon, but echo confirms AoS, as well as evidence of volume overload - will diurese and stop IVF        LLL CAP (community acquired pneumonia) Cont empiric abx tx   Elevated D-dimer Not stable enough to tolerate imaging for w/u at this time - on empiric lovenox   hypokalemia replete and follow - check Mg   S/P pacemaker Hx scant - pacer noted on CXR  Hx of HTN  Hx of CVA  Hyperglycemia Likely acute stress reaction - follow - will need A1c if begins to improve   Goals of Care I had a very frank discussion with the patient's granddaughter in law  at the bedside.  The patient herself is lethargic and unable to participate in this discussion.  The family reported that the patient had been voicing her desire to avoid living in a facility, or with chronic illness.  The patient had voiced recently that she was "ready to die."  I agree with the family that we should transition to comfort directed care only should the patient not improve with an additional 24 hours of ongoing acute medical care.  Code Status: NO CODE Family Communication: discussed w/ grand-daugther-in-law at bedside Disposition Plan: SDU  Consultants: none  Procedures: TTE - 5/30 -  moderate LVH - ejection fraction was 55% - evidence of RV volume overload - moderate and possibly severe aortic stenosis  Antibiotics: Rocephin 5/29 >> Azithro 5/30 >> Vanc 5/29  DVT prophylaxis: lovenox  HPI/Subjective: The patient is very somnolent today.  She continues to require high-level oxygen support.  She cannot provide a history of the present time.  Objective: Blood pressure 128/69, pulse 133, temperature 98.1 F (36.7 C), temperature source Axillary, resp. rate 18, height 5' (1.524 m), weight 49.3 kg (108 lb 11 oz), SpO2 94.00%.  Intake/Output Summary (Last 24 hours) at 08/03/12 1721 Last data filed at 08/03/12 1538  Gross per 24 hour  Intake 507.66 ml  Output    625 ml  Net -117.34 ml   Exam: General: Mild respiratory distress though the patient does appear comfortable  Lungs: Fine crackles throughout all fields Cardiovascular: Irregularly irregular with a soft 2/6 holosystolic murmur Abdomen: Nontender, nondistended, soft, bowel sounds positive, no rebound, no ascites, no appreciable mass Extremities: No significant cyanosis, or clubbing; 1+ edema bilateral lower extremities  Data Reviewed: Basic Metabolic Panel:  Recent Labs Lab 08/01/12 2120 08/02/12 0552 08/03/12 0542  NA 139 138 138  K 4.0 3.9 3.3*  CL 102 103 103  CO2 19 19 18*  GLUCOSE 174* 156*  143*  BUN 59* 66* 68*  CREATININE 1.10 1.02 1.09  CALCIUM 10.1 9.2 8.8  MG  --   --  2.4  PHOS  --   --  3.1   Liver Function Tests:  Recent Labs Lab 08/02/12 0552 08/03/12 0542  AST 54* 75*  ALT 81* 112*  ALKPHOS 138* 159*  BILITOT 1.3* 0.7  PROT 7.2 6.9  ALBUMIN 3.3* 3.2*   CBC:  Recent Labs Lab 08/01/12 2120 08/02/12 0552 08/03/12 0542  WBC 15.2* 14.1* 11.7*  NEUTROABS  --  11.3*  --   HGB 12.8 12.1 11.8*  HCT 36.9 34.9* 34.8*  MCV 91.1 90.6 90.2  PLT 280 245 258   Cardiac Enzymes:  Recent Labs Lab 08/01/12 2120 08/02/12 0552 08/02/12 1050 08/02/12 1707  TROPONINI <0.30 <0.30 <0.30 <0.30   Recent Results (from the past 240 hour(s))  MRSA PCR SCREENING     Status: None   Collection Time    08/02/12  2:24 AM      Result Value Range Status   MRSA by PCR NEGATIVE  NEGATIVE Final   Comment:            The GeneXpert MRSA Assay (FDA     approved for NASAL specimens     only), is one component of a     comprehensive MRSA colonization     surveillance program. It is not     intended to diagnose MRSA     infection nor to guide or     monitor treatment for     MRSA infections.  URINE CULTURE     Status: None   Collection Time    08/02/12  5:12 AM      Result Value Range Status   Specimen Description URINE, CATHETERIZED   Final   Special Requests Normal   Final   Culture  Setup Time 08/02/2012 10:19   Final   Colony Count NO GROWTH   Final   Culture NO GROWTH   Final   Report Status 08/03/2012 FINAL   Final  CULTURE, BLOOD (ROUTINE X 2)     Status: None   Collection Time    08/02/12  5:52 AM      Result Value Range Status   Specimen Description BLOOD RIGHT HAND   Final   Special Requests     Final   Value: BOTTLES DRAWN AEROBIC AND ANAEROBIC 6CC BLUE,5CC RED   Culture  Setup Time 08/02/2012 10:23   Final   Culture     Final   Value:        BLOOD CULTURE RECEIVED NO GROWTH TO DATE CULTURE WILL BE HELD FOR 5 DAYS BEFORE ISSUING A FINAL NEGATIVE  REPORT   Report Status PENDING   Incomplete  CULTURE, BLOOD (ROUTINE X 2)     Status: None   Collection Time    08/02/12  5:58 AM      Result Value Range Status   Specimen Description BLOOD LEFT HAND   Final   Special  Requests BOTTLES DRAWN AEROBIC ONLY [facility]   Final   Culture  Setup Time 08/02/2012 10:29   Final   Culture     Final   Value:        BLOOD CULTURE RECEIVED NO GROWTH TO DATE CULTURE WILL BE HELD FOR 5 DAYS BEFORE ISSUING A FINAL NEGATIVE REPORT   Report Status PENDING   Incomplete     Studies:  Recent x-ray studies have been reviewed in detail by the Attending Physician  Scheduled Meds:  Scheduled Meds: . aspirin EC  81 mg Oral Daily  . azithromycin  500 mg Intravenous Daily  . cefTRIAXone (ROCEPHIN)  IV  1 g Intravenous Q24H  . enoxaparin (LOVENOX) injection  50 mg Subcutaneous Q24H  . sodium chloride  3 mL Intravenous Q12H   Continuous Infusions: . sodium chloride 250 mL (08/03/12 0400)  . diltiazem (CARDIZEM) infusion 15 mg/hr (08/03/12 1536)    Time spent on care of this patient:   [facility] T  [facility] Office  [phone] Pager - Text Page per Loretha Stapler as per below:  On-Call/Text Page:      [email or web address]      password [email or web address]  If 7PM-7AM, please contact night-coverage [email or web address] Password [email or web address] 08/03/2012, 5:21 PM   LOS: 2 days

## 2012-08-03 NOTE — Progress Notes (Signed)
Post void residual checked 479cc, pt states she is unable to void, f/c placed 600cc return

## 2012-08-03 NOTE — Progress Notes (Signed)
Bladder scanned pt for a volume of 200 cc will continue to monitor Pt output.

## 2012-08-04 ENCOUNTER — Inpatient Hospital Stay (HOSPITAL_COMMUNITY): Payer: Medicare Other

## 2012-08-04 LAB — BASIC METABOLIC PANEL
BUN: 45 mg/dL — ABNORMAL HIGH (ref 6–23)
Chloride: 105 mEq/L (ref 96–112)
GFR calc Af Amer: 62 mL/min — ABNORMAL LOW (ref >90)
Potassium: 3.4 mEq/L — ABNORMAL LOW (ref 3.5–5.1)

## 2012-08-04 LAB — MAGNESIUM: Magnesium: 2.4 mg/dL (ref 1.5–2.5)

## 2012-08-04 MED ORDER — METOPROLOL TARTRATE 1 MG/ML IV SOLN
5.0000 mg | Freq: Four times a day (QID) | INTRAVENOUS | Status: AC
  Administered 2012-08-04 – 2012-08-05 (×5): 5 mg via INTRAVENOUS
  Filled 2012-08-04 (×7): qty 5

## 2012-08-04 MED ORDER — POTASSIUM CHLORIDE 10 MEQ/100ML IV SOLN
INTRAVENOUS | Status: AC
  Filled 2012-08-04: qty 100

## 2012-08-04 MED ORDER — AZITHROMYCIN 250 MG PO TABS
250.0000 mg | ORAL_TABLET | Freq: Every day | ORAL | Status: AC
  Administered 2012-08-04 – 2012-08-07 (×4): 250 mg via ORAL
  Filled 2012-08-04 (×4): qty 1

## 2012-08-04 NOTE — Evaluation (Signed)
Physical Therapy Evaluation Patient Details Name: Regina Burch MRN: [id number] DOB: [birth year] Today's Date: 08/04/2012 Time: 4098-1191 PT Time Calculation (min): 33 min  PT Assessment / Plan / Recommendation Clinical Impression  Patient is a 79 y/o female admitted with increased work of breathing found to be in A-fib with RVR.  Presents with decreased activity tolerance, decreased independence with mobility, decreased strength and balance and will benefit from skilled PT in the acute setting to maximize independence prior to d/c to SNF.     PT Assessment  Patient needs continued PT services    Follow Up Recommendations  SNF          Equipment Recommendations  None recommended by PT       Frequency Min 3X/week    Precautions / Restrictions Precautions Precautions: Fall Precaution Comments: severe aortic stenosis   Pertinent Vitals/Pain No specific pain complaints; HR max 150, SpO2 lowest 80% RRmax 44 on 4-6L O2 [place] with ambulation. RN aware      Mobility  Bed Mobility Bed Mobility: Not assessed Details for Bed Mobility Assistance: up in recliner Transfers Transfers: Sit to Stand;Stand to Sit Sit to Stand: 4: Min assist;From chair/3-in-1 Stand to Sit: 4: Min assist;To chair/3-in-1 Details for Transfer Assistance: cues for using armrests Ambulation/Gait Ambulation/Gait Assistance: 4: Min assist Ambulation Distance (Feet): 40 Feet Assistive device: Rolling walker Ambulation/Gait Assistance Details: cues for step length, assist to move walker anywhere but straight ahead, assist for balance/safety Gait Pattern: Decreased stride length;Shuffle Gait velocity: <1.8 ft/sec at high risk for falls    Exercises General Exercises - Lower Extremity Toe Raises: AROM;Both;10 reps;Seated   PT Diagnosis: Abnormality of gait;Generalized weakness;Altered mental status  PT Problem List: Decreased strength;Decreased activity tolerance;Decreased balance;Decreased mobility;Decreased  safety awareness;Decreased cognition PT Treatment Interventions: DME instruction;Gait training;Balance training;Functional mobility training;Patient/family education;Therapeutic activities;Therapeutic exercise   PT Goals Acute Rehab PT Goals PT Goal Formulation: Patient unable to participate in goal setting Time For Goal Achievement: 08/18/12 Potential to Achieve Goals: Good Pt will go Supine/Side to Sit: with supervision PT Goal: Supine/Side to Sit - Progress: Goal set today Pt will go Sit to Stand: with supervision PT Goal: Sit to Stand - Progress: Goal set today Pt will Transfer Bed to Chair/Chair to Bed: with supervision PT Transfer Goal: Bed to Chair/Chair to Bed - Progress: Goal set today Pt will Ambulate: 51 - 150 feet;with supervision;with least restrictive assistive device PT Goal: Ambulate - Progress: Goal set today Pt will Perform Home Exercise Program: with supervision, verbal cues required/provided PT Goal: Perform Home Exercise Program - Progress: Goal set today  Visit Information  Last PT Received On: 08/04/12 Assistance Needed: +2 (for safety)    Subjective Data  Subjective: My nose is stopped up Patient Stated Goal: Per nurse family hopes pt can return to Indep living, but realistic she may need higher level of care   Prior Functioning  Home Living Lives With: Alone Available Help at Discharge: Skilled Nursing Facility Type of Home: Independent living facility Additional Comments: Pt confused and no family present; RN states pt from Independent living at [place].  Family aware she may need higher level of care Communication Communication: Other (comment) (pt mumbles with low volume difficult to hear at times')    Cognition  Cognition Arousal/Alertness: Awake/alert Behavior During Therapy: WFL for tasks assessed/performed Overall Cognitive Status: Impaired/Different from baseline Area of Impairment: Memory;Orientation Orientation Level: Disoriented  to;Place;Time;Situation Memory: Decreased short-term memory    Extremity/Trunk Assessment Right Lower Extremity Assessment RLE ROM/Strength/Tone: Deficits  RLE ROM/Strength/Tone Deficits: AROM WFL, strength grossly 4/5 knee extension 3+/5 hip flexion RLE Sensation: WFL - Light Touch Left Lower Extremity Assessment LLE ROM/Strength/Tone: Deficits LLE ROM/Strength/Tone Deficits: AROM WFL, strength grossly 4/5 knee extension 3+/5 hip flexion LLE Sensation: WFL - Light Touch   Balance Balance Balance Assessed: Yes Static Sitting Balance Static Sitting - Balance Support: Feet supported Static Sitting - Level of Assistance: 5: Stand by assistance Static Sitting - Comment/# of Minutes: sitting on edge of chair for LE strength testing Static Standing Balance Static Standing - Balance Support: Bilateral upper extremity supported Static Standing - Level of Assistance: 4: Min assist  End of Session PT - End of Session Equipment Utilized During Treatment: Gait belt;Oxygen Activity Tolerance: Patient limited by fatigue Patient left: in chair;with call bell/phone within reach Nurse Communication: Other (comment) (vitals with ambulation)  GP     [facility] 08/04/2012, 10:58 AM Sheran Lawless, PT [phone] 08/04/2012

## 2012-08-04 NOTE — Progress Notes (Signed)
[facility] Progress Note [place] TEAM 1 - Stepdown/ICU TEAM   Regina Burch AOZ:[id number] DOB: March 07, 1911 DOA: 08/01/2012 PCP: Caffie Damme, MD  Brief narrative: 79 y.o. female w/ past medical history of Stroke; A-fib; and Hypertension who presented with 2 days of not feeling well and increased work of breathing. Grandson checked her pulse and it was racing. She was seen by her cardiologist and was diagnosed with A.fib and was started on metoprolol. She has hx of pacemaker. She had hx of TIA/CVA in remote past and was started on coumadin but had been off this for the past few years. She had been on aspirin 81 mg but this was stopped due to epistaxis. Denied any fever or chills, no cough.   She was taken to [facility] and was found to be in a.fib with RVR with HR in 140's she was also noted to be hypoxic. Diltiazem was given with good results. Initial CXR showed pulmonary edema with questionable infiltrate. The repeat imaging showed improvement of edema but still possible infiltrate. Patient was transferred to [facility] stepdown.   Assessment/Plan:  Atrial fibrillation with RVR Persisting despite high dose cardizem gtt - family does not wish to consider more aggressive approach, and I agree - cont digoxin - with modest stabilization of BP, will try low dose BB as well  Acute hypoxic respiratory failure due to:      Pulmonary edema w/ AoS Was hoping that edema was primarily rate related phenomenon, but echo confirms AoS, as well as evidence of volume overload - cont to diurese as tolerated        LLL CAP (community acquired pneumonia) Cont empiric abx tx - f/u CXR in AM  Elevated D-dimer Not stable enough to tolerate imaging for w/u at this time - on empiric lovenox   hypokalemia Cont to replete and follow - Mg is normal   S/P pacemaker Hx scant - pacer noted on CXR  Hx of HTN  Hx of CVA  Hyperglycemia Likely acute stress reaction - follow - will check A1c in AM labs  Goals of  Care I confirmed the previously stated goals of care with the patient herself today along with her grandson and her grandson's wife.  Code Status: NO CODE Family Communication: discussed w/ grandson and grandsons wife Disposition Plan: SDU  Consultants: none  Procedures: TTE - 5/30 -  moderate LVH - ejection fraction was 55% - evidence of RV volume overload - moderate and possibly severe aortic stenosis  Antibiotics: Rocephin 5/29 >> Azithro 5/30 >> Vanc 5/29  DVT prophylaxis: lovenox  HPI/Subjective: The patient is much more alert today.  She is conversant and quite pleasant.  She denies current pain.  She denies current shortness of breath.  Objective: Blood pressure 132/67, pulse 100, temperature 98 F (36.7 C), temperature source Oral, resp. rate 42, height 5' (1.524 m), weight 49.3 kg (108 lb 11 oz), SpO2 87.00%.  Intake/Output Summary (Last 24 hours) at 08/04/12 1751 Last data filed at 08/04/12 1500  Gross per 24 hour  Intake    960 ml  Output   2325 ml  Net  -1365 ml   Exam: General: Mild respiratory distress though the patient does appear comfortable Lungs: Fine crackles throughout all fields - no wheeze Cardiovascular: Irregularly irregular with a soft 2/6 holosystolic murmur Abdomen: Nontender, nondistended, soft, bowel sounds positive, no rebound, no ascites, no appreciable mass Extremities: No significant cyanosis, or clubbing; trace edema bilateral lower extremities  Data Reviewed: Basic Metabolic Panel:  Recent Labs Lab 08/01/12 2120 08/02/12 0552 08/03/12 0542 08/04/12 0600  NA 139 138 138 138  K 4.0 3.9 3.3* 3.4*  CL 102 103 103 105  CO2 19 19 18* 22  GLUCOSE 174* 156* 143* 134*  BUN 59* 66* 68* 45*  CREATININE 1.10 1.02 1.09 0.87  CALCIUM 10.1 9.2 8.8 8.6  MG  --   --  2.4 2.4  PHOS  --   --  3.1  --    Liver Function Tests:  Recent Labs Lab 08/02/12 0552 08/03/12 0542  AST 54* 75*  ALT 81* 112*  ALKPHOS 138* 159*  BILITOT 1.3*  0.7  PROT 7.2 6.9  ALBUMIN 3.3* 3.2*   CBC:  Recent Labs Lab 08/01/12 2120 08/02/12 0552 08/03/12 0542  WBC 15.2* 14.1* 11.7*  NEUTROABS  --  11.3*  --   HGB 12.8 12.1 11.8*  HCT 36.9 34.9* 34.8*  MCV 91.1 90.6 90.2  PLT 280 245 258   Cardiac Enzymes:  Recent Labs Lab 08/01/12 2120 08/02/12 0552 08/02/12 1050 08/02/12 1707  TROPONINI <0.30 <0.30 <0.30 <0.30   Recent Results (from the past 240 hour(s))  MRSA PCR SCREENING     Status: None   Collection Time    08/02/12  2:24 AM      Result Value Range Status   MRSA by PCR NEGATIVE  NEGATIVE Final   Comment:            The GeneXpert MRSA Assay (FDA     approved for NASAL specimens     only), is one component of a     comprehensive MRSA colonization     surveillance program. It is not     intended to diagnose MRSA     infection nor to guide or     monitor treatment for     MRSA infections.  URINE CULTURE     Status: None   Collection Time    08/02/12  5:12 AM      Result Value Range Status   Specimen Description URINE, CATHETERIZED   Final   Special Requests Normal   Final   Culture  Setup Time 08/02/2012 10:19   Final   Colony Count NO GROWTH   Final   Culture NO GROWTH   Final   Report Status 08/03/2012 FINAL   Final  CULTURE, BLOOD (ROUTINE X 2)     Status: None   Collection Time    08/02/12  5:52 AM      Result Value Range Status   Specimen Description BLOOD RIGHT HAND   Final   Special Requests     Final   Value: BOTTLES DRAWN AEROBIC AND ANAEROBIC 6CC BLUE,5CC RED   Culture  Setup Time 08/02/2012 10:23   Final   Culture     Final   Value:        BLOOD CULTURE RECEIVED NO GROWTH TO DATE CULTURE WILL BE HELD FOR 5 DAYS BEFORE ISSUING A FINAL NEGATIVE REPORT   Report Status PENDING   Incomplete  CULTURE, BLOOD (ROUTINE X 2)     Status: None   Collection Time    08/02/12  5:58 AM      Result Value Range Status   Specimen Description BLOOD LEFT HAND   Final   Special Requests BOTTLES DRAWN AEROBIC  ONLY [facility]   Final   Culture  Setup Time 08/02/2012 10:29   Final   Culture     Final   Value:  BLOOD CULTURE RECEIVED NO GROWTH TO DATE CULTURE WILL BE HELD FOR 5 DAYS BEFORE ISSUING A FINAL NEGATIVE REPORT   Report Status PENDING   Incomplete     Studies:  Recent x-ray studies have been reviewed in detail by the Attending Physician  Scheduled Meds:  Scheduled Meds: . aspirin EC  81 mg Oral Daily  . azithromycin  500 mg Intravenous Daily  . cefTRIAXone (ROCEPHIN)  IV  1 g Intravenous Q24H  . digoxin  0.125 mg Intravenous Daily  . enoxaparin (LOVENOX) injection  50 mg Subcutaneous Q24H  . furosemide  40 mg Intravenous Q12H   Continuous Infusions: . sodium chloride 250 mL (08/04/12 0100)  . diltiazem (CARDIZEM) infusion 15 mg/hr (08/04/12 1107)    Time spent on care of this patient:   [facility] T  [facility] Office  [phone] Pager - Text Page per Loretha Stapler as per below:  On-Call/Text Page:      [email or web address]      password [email or web address]  If 7PM-7AM, please contact night-coverage [email or web address] Password [email or web address] 08/04/2012, 5:51 PM   LOS: 3 days

## 2012-08-05 ENCOUNTER — Inpatient Hospital Stay (HOSPITAL_COMMUNITY): Payer: Medicare Other

## 2012-08-05 LAB — BASIC METABOLIC PANEL
CO2: 24 mEq/L (ref 19–32)
Calcium: 8.2 mg/dL — ABNORMAL LOW (ref 8.4–10.5)
Creatinine, Ser: 0.76 mg/dL (ref 0.50–1.10)
GFR calc non Af Amer: 67 mL/min — ABNORMAL LOW (ref >90)
Glucose, Bld: 132 mg/dL — ABNORMAL HIGH (ref 70–99)
Sodium: 135 mEq/L (ref 135–145)

## 2012-08-05 LAB — HEMOGLOBIN A1C: Mean Plasma Glucose: 117 mg/dL — ABNORMAL HIGH (ref <117)

## 2012-08-05 MED ORDER — FUROSEMIDE 10 MG/ML IJ SOLN
40.0000 mg | Freq: Every day | INTRAMUSCULAR | Status: DC
  Administered 2012-08-06 – 2012-08-07 (×2): 40 mg via INTRAVENOUS
  Filled 2012-08-05 (×2): qty 4

## 2012-08-05 MED ORDER — METOPROLOL TARTRATE 12.5 MG HALF TABLET
12.5000 mg | ORAL_TABLET | Freq: Two times a day (BID) | ORAL | Status: DC
  Administered 2012-08-06 – 2012-08-09 (×7): 12.5 mg via ORAL
  Filled 2012-08-05 (×8): qty 1

## 2012-08-05 MED ORDER — ENOXAPARIN SODIUM 40 MG/0.4ML ~~LOC~~ SOLN
40.0000 mg | SUBCUTANEOUS | Status: DC
  Administered 2012-08-06 – 2012-08-07 (×2): 40 mg via SUBCUTANEOUS
  Filled 2012-08-05 (×2): qty 0.4

## 2012-08-05 MED ORDER — POTASSIUM CHLORIDE CRYS ER 20 MEQ PO TBCR
40.0000 meq | EXTENDED_RELEASE_TABLET | Freq: Two times a day (BID) | ORAL | Status: AC
  Administered 2012-08-05 – 2012-08-06 (×3): 40 meq via ORAL
  Filled 2012-08-05 (×3): qty 2

## 2012-08-05 MED ORDER — DILTIAZEM HCL 60 MG PO TABS
60.0000 mg | ORAL_TABLET | Freq: Four times a day (QID) | ORAL | Status: DC
  Administered 2012-08-05 – 2012-08-06 (×4): 60 mg via ORAL
  Filled 2012-08-05 (×8): qty 1

## 2012-08-05 MED ORDER — DILTIAZEM HCL 100 MG IV SOLR: 5.0000 mg/h | INTRAVENOUS | Status: AC

## 2012-08-05 MED ORDER — DIGOXIN 125 MCG PO TABS
0.1250 mg | ORAL_TABLET | Freq: Every day | ORAL | Status: DC
  Administered 2012-08-06 – 2012-08-09 (×4): 0.125 mg via ORAL
  Filled 2012-08-05 (×4): qty 1

## 2012-08-05 NOTE — Progress Notes (Signed)
[facility] Progress Note [place] TEAM 1 - Stepdown/ICU TEAM   Regina Burch [id number] DOB: [birth year] DOA: 08/01/2012 PCP: Caffie Damme, MD  Brief narrative: 79 y.o. female w/ past medical history of Stroke; A-fib; and Hypertension who presented with 2 days of not feeling well and increased work of breathing. Grandson checked her pulse and it was racing. She was seen by her cardiologist and was diagnosed with A.fib and was started on metoprolol. She has hx of pacemaker. She had hx of TIA/CVA in remote past and was started on coumadin but had been off this for the past few years. She had been on aspirin 81 mg but this was stopped due to epistaxis. Denied any fever or chills, no cough.   She was taken to [facility] and was found to be in a.fib with RVR with HR in 140's she was also noted to be hypoxic. Diltiazem was given with good results. Initial CXR showed pulmonary edema with questionable infiltrate. The repeat imaging showed improvement of edema but still possible infiltrate. Patient was transferred to [facility] stepdown.   Assessment/Plan:  Atrial fibrillation with RVR Blood pressure is finally well controlled with use of Cardizem, digoxin, and beta blocker - I will attempt to transition from IV Cardizem to by mouth Cardizem today and check a digoxin level in the morning - begin PT/OT today  Acute hypoxic respiratory failure due to:      Pulmonary edema w/ AoS Was hoping that edema was primarily rate related phenomenon, but echo confirms AoS, as well as evidence of volume overload - cont to diurese as tolerated -  patient is net -3.5 L as of this morning - will slow but continue diuresis and follow closely       LLL CAP (community acquired pneumonia) Cont empiric abx tx - f/u CXR this AM reveals no significant change   Elevated D-dimer Has not been stable enough to tolerate imaging for w/u - was on empiric lovenox tx initial portion of hospital stay - patient is at an exceedingly  high risk of bleeding given her advanced age and frail health - her acute illness likely explains her elevated d-dimer - nonetheless she does not wish to undergo aggressive long-term care and therefore I feel it is reasonable to stop empiric anticoagulation and not pursue a pulmonary embolism workup further  hypokalemia Cont to replete and follow - Mg is normal - slowing diuresis should help   S/P pacemaker Hx scant - pacer noted on CXR  Hx of HTN  Hx of CVA  Hyperglycemia Likely acute stress reaction - follow - A1c not c/w DM - would not pursue tx at this age regardless  Goals of Care I confirmed the previously stated goals of care with the patient herself today along with her grandson and her grandson's wife - the goal is to avoid aggressive tx and transition to comfort care only if condition were to detiorate  Code Status: NO CODE Family Communication: discussed w/ grandson  Disposition Plan: SDU  Consultants: none  Procedures: TTE - 5/30 -  moderate LVH - ejection fraction was 55% - evidence of RV volume overload - moderate and possibly severe aortic stenosis  Antibiotics: Rocephin 5/29 >> Azithro 5/30 >> Vanc 5/29  DVT prophylaxis: lovenox  HPI/Subjective: The patient is alert today.  She denies chest pain fevers chills nausea or vomiting.  She is intermittently confused.  Objective: Blood pressure 152/55, pulse 119, temperature 98.1 F (36.7 C), temperature source Oral, resp. rate 28,  height 5' (1.524 m), weight 49.3 kg (108 lb 11 oz), SpO2 84.00%.  Intake/Output Summary (Last 24 hours) at 08/05/12 1717 Last data filed at 08/05/12 1500  Gross per 24 hour  Intake 1116.67 ml  Output   2575 ml  Net -1458.33 ml   Exam: General: No evidence of respiratory distress Lungs: Fine crackles throughout all fields but less than appreciated yesterday - no wheeze Cardiovascular: Irregularly irregular with a soft 2/6 holosystolic murmur Abdomen: Nontender, nondistended,  soft, bowel sounds positive, no rebound, no ascites, no appreciable mass Extremities: No significant cyanosis, or clubbing; trace edema bilateral lower extremities  Data Reviewed: Basic Metabolic Panel:  Recent Labs Lab 08/01/12 2120 08/02/12 0552 08/03/12 0542 08/04/12 0600 08/05/12 0455  NA 139 138 138 138 135  K 4.0 3.9 3.3* 3.4* 3.3*  CL 102 103 103 105 102  CO2 19 19 18* 22 24  GLUCOSE 174* 156* 143* 134* 132*  BUN 59* 66* 68* 45* 25*  CREATININE 1.10 1.02 1.09 0.87 0.76  CALCIUM 10.1 9.2 8.8 8.6 8.2*  MG  --   --  2.4 2.4  --   PHOS  --   --  3.1  --   --    Liver Function Tests:  Recent Labs Lab 08/02/12 0552 08/03/12 0542  AST 54* 75*  ALT 81* 112*  ALKPHOS 138* 159*  BILITOT 1.3* 0.7  PROT 7.2 6.9  ALBUMIN 3.3* 3.2*   CBC:  Recent Labs Lab 08/01/12 2120 08/02/12 0552 08/03/12 0542  WBC 15.2* 14.1* 11.7*  NEUTROABS  --  11.3*  --   HGB 12.8 12.1 11.8*  HCT 36.9 34.9* 34.8*  MCV 91.1 90.6 90.2  PLT 280 245 258   Cardiac Enzymes:  Recent Labs Lab 08/01/12 2120 08/02/12 0552 08/02/12 1050 08/02/12 1707  TROPONINI <0.30 <0.30 <0.30 <0.30   Recent Results (from the past 240 hour(s))  MRSA PCR SCREENING     Status: None   Collection Time    08/02/12  2:24 AM      Result Value Range Status   MRSA by PCR NEGATIVE  NEGATIVE Final   Comment:            The GeneXpert MRSA Assay (FDA     approved for NASAL specimens     only), is one component of a     comprehensive MRSA colonization     surveillance program. It is not     intended to diagnose MRSA     infection nor to guide or     monitor treatment for     MRSA infections.  URINE CULTURE     Status: None   Collection Time    08/02/12  5:12 AM      Result Value Range Status   Specimen Description URINE, CATHETERIZED   Final   Special Requests Normal   Final   Culture  Setup Time 08/02/2012 10:19   Final   Colony Count NO GROWTH   Final   Culture NO GROWTH   Final   Report Status  08/03/2012 FINAL   Final  CULTURE, BLOOD (ROUTINE X 2)     Status: None   Collection Time    08/02/12  5:52 AM      Result Value Range Status   Specimen Description BLOOD RIGHT HAND   Final   Special Requests     Final   Value: BOTTLES DRAWN AEROBIC AND ANAEROBIC 6CC BLUE,5CC RED   Culture  Setup Time 08/02/2012 10:23  Final   Culture     Final   Value:        BLOOD CULTURE RECEIVED NO GROWTH TO DATE CULTURE WILL BE HELD FOR 5 DAYS BEFORE ISSUING A FINAL NEGATIVE REPORT   Report Status PENDING   Incomplete  CULTURE, BLOOD (ROUTINE X 2)     Status: None   Collection Time    08/02/12  5:58 AM      Result Value Range Status   Specimen Description BLOOD LEFT HAND   Final   Special Requests BOTTLES DRAWN AEROBIC ONLY 6CC   Final   Culture  Setup Time 08/02/2012 10:29   Final   Culture     Final   Value:        BLOOD CULTURE RECEIVED NO GROWTH TO DATE CULTURE WILL BE HELD FOR 5 DAYS BEFORE ISSUING A FINAL NEGATIVE REPORT   Report Status PENDING   Incomplete     Studies:  Recent x-ray studies have been reviewed in detail by the Attending Physician  Scheduled Meds:  Scheduled Meds: . aspirin EC  81 mg Oral Daily  . azithromycin  250 mg Oral Daily  . cefTRIAXone (ROCEPHIN)  IV  1 g Intravenous Q24H  . digoxin  0.125 mg Intravenous Daily  . diltiazem  60 mg Oral Q6H  . enoxaparin (LOVENOX) injection  50 mg Subcutaneous Q24H  . furosemide  40 mg Intravenous Q12H  . metoprolol  5 mg Intravenous Q6H    Time spent on care of this patient:   [facility]  [facility] Office  [phone] Pager - Text Page per Loretha Stapler as per below:  On-Call/Text Page:      [email or web address]      password [email or web address]  If 7PM-7AM, please contact night-coverage [email or web address] Password [facility] 08/05/2012, 5:17 PM   LOS: 4 days

## 2012-08-05 NOTE — Evaluation (Signed)
Occupational Therapy Evaluation Patient Details Name: Regina Burch MRN: [id number] DOB: 01-Apr-1911 Today's Date: 08/05/2012 Time: 1351-1410 OT Time Calculation (min): 19 min  OT Assessment / Plan / Recommendation Clinical Impression  Pt is a 79 year old woman who was admitted with breathing difficulty due to pulmonary edema, Afib with RVR, severe aortic stenosis, and hypoxia.  Pt presents with poor endurance, generalized weakness, decreased balance, and impaired cognition.  Recommending SNF.  Will defer OT to SNF.    OT Assessment  All further OT needs can be met in the next venue of care    Follow Up Recommendations  SNF    Barriers to Discharge      Equipment Recommendations       Recommendations for Other Services    Frequency       Precautions / Restrictions Precautions Precautions: Fall Precaution Comments: severe aortic stenosis, watch sats   Pertinent Vitals/Pain No pain, on 6L 02 with sats in upper 80s to low 90s    ADL  Eating/Feeding: Minimal assistance (extremely slow with increased spillage at bed level) Where Assessed - Eating/Feeding: Chair Grooming: Wash/dry hands;Minimal assistance (for thoroughness) Where Assessed - Grooming: Supported sitting Upper Body Bathing: Maximal assistance Where Assessed - Upper Body Bathing: Unsupported sitting Lower Body Bathing: +1 Total assistance Where Assessed - Lower Body Bathing: Unsupported sitting;Supported sit to stand Upper Body Dressing: Moderate assistance Where Assessed - Upper Body Dressing: Unsupported sitting Lower Body Dressing: +1 Total assistance Where Assessed - Lower Body Dressing: Unsupported sitting;Supported sit to stand Transfers/Ambulation Related to ADLs: transferred only, min assist supported at forearms ADL Comments: Pt with severely diminished activity tolerance impeding ability to perform ADL.  Very slow movements.  Pt needs step by step verbal cues for task persistence.    OT Diagnosis:  Generalized weakness;Cognitive deficits  OT Problem List: Decreased strength;Decreased activity tolerance;Impaired balance (sitting and/or standing);Decreased coordination;Decreased cognition;Decreased knowledge of use of DME or AE;Cardiopulmonary status limiting activity;Impaired UE functional use OT Treatment Interventions:     OT Goals    Visit Information  Last OT Received On: 08/05/12    Subjective Data  Subjective: "Let me have that ticket so I can pay." (referring to her lunch order)   Prior Functioning     Home Living Lives With: Alone Available Help at Discharge: Skilled Nursing Facility Type of Home: Independent living facility ([facility] Home Access: Elevator Home Layout: One level Bathroom Shower/Tub: Walk-in shower Additional Comments: Pt reports that she walked from her apartment to the dining room PTA. Communication Communication: Other (comment) (low volume) Dominant Hand: Right         Vision/Perception Vision - History Baseline Vision: Wears glasses all the time Patient Visual Report: No change from baseline   Cognition  Cognition Arousal/Alertness: Awake/alert Behavior During Therapy: WFL for tasks assessed/performed Overall Cognitive Status: Impaired/Different from baseline Area of Impairment: Memory;Orientation;Problem solving Orientation Level: Disoriented to;Place;Time;Situation Memory: Decreased short-term memory Problem Solving: Slow processing;Requires verbal cues    Extremity/Trunk Assessment Right Upper Extremity Assessment RUE ROM/Strength/Tone: Deficits;Unable to fully assess;Due to impaired cognition RUE ROM/Strength/Tone Deficits: 3-/5 shoulder, 3/5 elbow to hand grossly RUE Coordination: Deficits RUE Coordination Deficits: difficulty directing spoon to mouth, eats off edge of spoon with spillage, decreased reach Left Upper Extremity Assessment LUE ROM/Strength/Tone: Deficits;Unable to fully assess;Due to impaired  cognition LUE ROM/Strength/Tone Deficits: 3-/5 shoulder, 3/5 elbow to hand, grossly LUE Coordination: Deficits LUE Coordination Deficits: decreased reach     Mobility Bed Mobility Bed Mobility: Not  assessed Transfers Sit to Stand: 3: Mod assist;With upper extremity assist;From chair/3-in-1 Stand to Sit: With upper extremity assist;To chair/3-in-1;4: Min assist     Exercise     Balance     End of Session OT - End of Session Activity Tolerance: Patient limited by fatigue;Treatment limited secondary to medical complications (Comment) (pt with Afib)  GO     Evern Bio 08/05/2012, 2:33 PM [phone]

## 2012-08-05 NOTE — Progress Notes (Signed)
Clinical Social Worker (CSW) has left a message for pt grandson to discuss SNF placement. CSW awaiting a returned call.  Full assessment to follow. Theresia Bough, MSW, Theresia Majors [phone]

## 2012-08-05 NOTE — Progress Notes (Signed)
Physical Therapy Treatment Patient Details Name: Regina Burch MRN: [id number] DOB: 1912-02-04 Today's Date: 08/05/2012 Time: 4098-1191 PT Time Calculation (min): 23 min  PT Assessment / Plan / Recommendation Comments on Treatment Session  Patient with increased O2 today prior to ambulation, but less drop in sats or increase in heartrate compared to yesteraday.  Still weak and requiring good amount of assist for safety with mobility.  STSNF level therapies still recommended.    Follow Up Recommendations  SNF           Equipment Recommendations  None recommended by PT       Frequency Min 3X/week   Plan Discharge plan remains appropriate    Precautions / Restrictions Precautions Precautions: Fall Precaution Comments: severe aortic stenosis, watch sats   Pertinent Vitals/Pain Left hand pain with IV site, RN aware    Mobility  Bed Mobility Bed Mobility: Rolling Left;Left Sidelying to Sit;Sitting - Scoot to Edge of Bed Rolling Left: With rail;4: Min assist Left Sidelying to Sit: With rails;2: Max assist Sitting - Scoot to Edge of Bed: 2: Max assist Details for Bed Mobility Assistance: assisted feet off bed and trunk upright cues for technique Transfers Sit to Stand: 4: Min assist;From bed;With upper extremity assist Stand to Sit: 4: Min assist;To chair/3-in-1 Details for Transfer Assistance: cues for safe hand placement Ambulation/Gait Ambulation/Gait Assistance: 4: Min assist;3: Mod assist Ambulation Distance (Feet): 40 Feet Assistive device: Rolling walker Ambulation/Gait Assistance Details: assist for turning walker, cues for posture and proximity to walker Gait Pattern: Step-through pattern;Trunk flexed;Shuffle    Exercises General Exercises - Lower Extremity Ankle Circles/Pumps: Both;10 reps;Supine Heel Slides: AROM;Both;10 reps;Supine     PT Goals Acute Rehab PT Goals Pt will go Supine/Side to Sit: with supervision PT Goal: Supine/Side to Sit - Progress:  Progressing toward goal Pt will go Sit to Stand: with supervision PT Goal: Sit to Stand - Progress: Progressing toward goal Pt will Ambulate: 51 - 150 feet;with supervision;with least restrictive assistive device PT Goal: Ambulate - Progress: Progressing toward goal Pt will Perform Home Exercise Program: with supervision, verbal cues required/provided PT Goal: Perform Home Exercise Program - Progress: Progressing toward goal  Visit Information  Last PT Received On: 08/05/12    Subjective Data  Subjective: My hand really hurts   Cognition  Cognition Arousal/Alertness: Awake/alert Behavior During Therapy: WFL for tasks assessed/performed Overall Cognitive Status: Impaired/Different from baseline Area of Impairment: Memory;Orientation;Problem solving Orientation Level: Disoriented to;Place;Time;Situation Memory: Decreased short-term memory Problem Solving: Slow processing;Requires verbal cues    Balance  Static Sitting Balance Static Sitting - Balance Support: Feet supported;Left upper extremity supported Static Sitting - Level of Assistance: 6: Modified independent (Device/Increase time) Static Standing Balance Static Standing - Balance Support: Bilateral upper extremity supported Static Standing - Level of Assistance: 4: Min assist  End of Session PT - End of Session Equipment Utilized During Treatment: Gait belt;Oxygen Activity Tolerance: Patient limited by fatigue Patient left: in chair;with nursing in room;with call bell/phone within reach   GP     [facility] 08/05/2012, 3:40 PM [place], [place] [phone] 08/05/2012

## 2012-08-05 NOTE — Progress Notes (Signed)
ANTICOAGULATION CONSULT NOTE - Follow Up  Pharmacy Consult for lovenox Indication: atrial fibrillation  No Known Allergies  Patient Measurements: Height: 5' (152.4 cm) Weight: 108 lb 11 oz (49.3 kg) IBW/kg (Calculated) : 45.5  Vital Signs: Temp: 98.4 F (36.9 C) (06/02 0801) Temp src: Oral (06/02 0801) BP: 137/62 mmHg (06/02 0801) Pulse Rate: 98 (06/02 0801)  Labs:  Recent Labs  08/02/12 1050 08/02/12 1707 08/03/12 0542 08/04/12 0600 08/05/12 0455  HGB  --   --  11.8*  --   --   HCT  --   --  34.8*  --   --   PLT  --   --  258  --   --   CREATININE  --   --  1.09 0.87 0.76  TROPONINI <0.30 <0.30  --   --   --    Estimated Creatinine Clearance: 27.5 ml/min (by C-G formula based on Cr of 0.76).  Medical History: Past Medical History  Diagnosis Date  . Stroke   . A-fib   . Hypertension    Assessment: Patient is a 79 y.o F with new onset Afib.  HR still elevated despite IV Diltiazem and Digoxin.  She is on full dose Lovenox at 50mg  SQ daily (adjusted for weight and renal function).  Her last CBC was on 5/31 which was stable.  No noted bleeding complications.  Goal of Therapy:  Anti-Xa level 0.6-1.2 units/ml 4hrs after LMWH dose given Monitor platelets by anticoagulation protocol: Yes   Plan:  1)  Continue lovenox 50mg  SQ q24h  2)  Obtain a CBC with AM labs and q 72 hr while on LMWH  Nadara Mustard, PharmD., MS Clinical Pharmacist Pager:  [phone] Thank you for allowing pharmacy to be part of this patients care team. 08/05/2012,10:23 AM

## 2012-08-05 NOTE — Progress Notes (Addendum)
Clinical Social Work Department CLINICAL SOCIAL WORK PLACEMENT NOTE 08/05/2012  Patient:  Regina Burch, Regina Burch  Account Number:  [id number] Admit date:  08/01/2012  Clinical Social Worker:  Theresia Bough, Theresia Majors  Date/time:  08/05/2012 03:50 PM  Clinical Social Work is seeking post-discharge placement for this patient at the following level of care:   SKILLED NURSING   (*CSW will update this form in Epic as items are completed)   08/05/2012  Patient/family provided with [facility] Department of Clinical Social Work's list of facilities offering this level of care within the geographic area requested by the patient (or if unable, by the patient's family).  08/05/2012  Patient/family informed of their freedom to choose among providers that offer the needed level of care, that participate in Medicare, Medicaid or managed care program needed by the patient, have an available bed and are willing to accept the patient.  08/05/2012  Patient/family informed of [facility]' ownership interest in [facility], as well as of the fact that they are under no obligation to receive care at this facility.  PASARR submitted to EDS on 08/05/2012 PASARR number received from EDS on   FL2 transmitted to all facilities in geographic area requested by pt/family on  08/05/2012 FL2 transmitted to all facilities within larger geographic area on   Patient informed that his/her managed care company has contracts with or will negotiate with  certain facilities, including the following:     Patient/family informed of bed offers received:  08/07/12 Patient chooses bed at  [facility] Physician recommends and patient chooses bed at    [facility] to be [facility] to [facility] on 08/09/2012   Patient to be transferred to facility by  Ambulance  The following physician request were entered in Epic:   Additional Comments: Theresia Bough, MSW, [employer] [phone]

## 2012-08-05 NOTE — Progress Notes (Signed)
**Note Regina-Identified via Obfuscation** Clinical Social Work Department BRIEF PSYCHOSOCIAL ASSESSMENT 08/05/2012  Patient:  Regina Burch, Regina Burch     Account Number:  [id number]     Admit date:  08/01/2012  Clinical Social Worker:  Lourdes Sledge  Date/Time:  08/05/2012 03:41 PM  Referred by:  CSW  Date Referred:  08/05/2012 Referred for  SNF Placement   Other Referral:   Interview type:  Family Other interview type:   CSW completed assessment with pt grandson Regina Burch [phone].    PSYCHOSOCIAL DATA Living Status:  ALONE Admitted from facility:   Level of care:  Independent Living Primary support name:  Regina Burch [phone]. Primary support relationship to patient:  FAMILY Degree of support available:   Pt grandson presents as pt main contact.    CURRENT CONCERNS Current Concerns  Post-Acute Placement   Other Concerns:    SOCIAL WORK ASSESSMENT / PLAN CSW informed that pt admitted from [facility] however PT recommending SNF placement.    CSW visited pt room however pt presents confused. CSW did not observe any family in pt room so CSW contacted pt grandson Regina Burch [phone] who is pt main contact. CSW explored pt living situation and amount ofsupport available. Grandson stated pt has lived at her independent living facility for 10-12 years however her facility does not provide a higher level of care. CSW informed grandson of PT recommendations for SNF placement and explored whether grandson would be agreeable to a SNF search. Grandson stated he understood pt need for a higher level of care however was concerned about pt insurance covering the cost. CSW informed grandson that CSW could do a SNF search and follow up regarding pt copay cost. Education administrator and agreeable and stated that [facility] is the closest to his home however he would be open to a [employer].    CSW to do a SNF search, and follow up with bed offers & copay  cost.   Assessment/plan status:  Psychosocial Support/Ongoing Assessment of Needs Other assessment/ plan:   Information/referral to community resources:   CSW left a SNF list and CSW contact information in pt room.    PATIENT'S/FAMILY'S RESPONSE TO PLAN OF CARE: Pt able to communicate verbally however presents confused. Assessment completed with pt grandson Regina Burch [phone] who is agreeable to a SNF search.       Theresia Bough, MSW, Theresia Majors [phone]

## 2012-08-06 DIAGNOSIS — E876 Hypokalemia: Secondary | ICD-10-CM | POA: Diagnosis present

## 2012-08-06 LAB — BASIC METABOLIC PANEL
BUN: 19 mg/dL (ref 6–23)
Chloride: 104 mEq/L (ref 96–112)
Creatinine, Ser: 0.67 mg/dL (ref 0.50–1.10)
GFR calc Af Amer: 81 mL/min — ABNORMAL LOW (ref >90)
Glucose, Bld: 123 mg/dL — ABNORMAL HIGH (ref 70–99)
Potassium: 3.6 mEq/L (ref 3.5–5.1)

## 2012-08-06 LAB — CBC
HCT: 34.2 % — ABNORMAL LOW (ref 36.0–46.0)
Hemoglobin: 11.9 g/dL — ABNORMAL LOW (ref 12.0–15.0)
MCH: 31.1 pg (ref 26.0–34.0)
MCHC: 34.8 g/dL (ref 30.0–36.0)
MCV: 89.3 fL (ref 78.0–100.0)
RDW: 13.9 % (ref 11.5–15.5)

## 2012-08-06 MED ORDER — DILTIAZEM HCL ER COATED BEADS 240 MG PO CP24
240.0000 mg | ORAL_CAPSULE | Freq: Every day | ORAL | Status: DC
  Administered 2012-08-06 – 2012-08-09 (×4): 240 mg via ORAL
  Filled 2012-08-06 (×4): qty 1

## 2012-08-06 NOTE — Progress Notes (Addendum)
Clinical Child psychotherapist (CSW) contacted pt grandson to provide bed offers however grandson unavailable at this time. Grandson requests to return CSW call later this afternoon. CSW to await call.  *CSW has left a SNF list with facility responses in pt room as CSW was unable to provide bed offers to grandson by phone.  Theresia Bough, MSW, Theresia Majors [phone]

## 2012-08-06 NOTE — Progress Notes (Signed)
[facility] Progress Note [place] TEAM 1 - Stepdown/ICU TEAM   Regina Burch ZOX:[id number] DOB: [birth year] DOA: 08/01/2012 PCP: Caffie Damme, MD  Brief narrative: 79 y.o. female w/ past medical history of Stroke; A-fib; and Hypertension who presented with 2 days of not feeling well and increased work of breathing. Grandson checked her pulse and it was racing. She was seen by her cardiologist and was diagnosed with A.fib and was started on metoprolol. She has hx of pacemaker. She had hx of TIA/CVA in remote past and was started on coumadin but had been off this for the past few years. She had been on aspirin 81 mg but this was stopped due to epistaxis. Denied any fever or chills, no cough.   She was taken to [facility] and was found to be in a.fib with RVR with HR in 140's she was also noted to be hypoxic. Diltiazem was given with good results. Initial CXR showed pulmonary edema with questionable infiltrate. The repeat imaging showed improvement of edema but still possible infiltrate. Patient was transferred to [facility] stepdown.   Assessment/Plan:  Atrial fibrillation with RVR Blood pressure is finally well controlled with use of Cardizem, digoxin, and beta blocker - I will attempt to transition from IV Cardizem to by mouth Cardizem today and check a digoxin level in the morning - begin PT/OT today  Acute hypoxic respiratory failure due to:       Pulmonary edema w/ AoS Was hoping that edema was primarily rate related phenomenon, but echo confirms AoS- severity difficult to assess - as well as evidence of volume overload - cont to diurese as tolerated -  patient is net -5 L-  continue diuresis and follow closely       LLL CAP (community acquired pneumonia) Cont empiric abx tx - f/u CXR on 6/2 reveals no significant change   Elevated D-dimer Has not been stable enough to tolerate imaging for w/u - was on empiric lovenox tx initial portion of hospital stay - patient is at an exceedingly high  risk of bleeding given her advanced age and frail health - her acute illness likely explains her elevated d-dimer - nonetheless she does not wish to undergo aggressive long-term care and therefore I feel it is reasonable to stop empiric anticoagulation and not pursue a pulmonary embolism workup further  hypokalemia Cont to replete and follow - Mg is normal -   S/P pacemaker Hx scant - pacer noted on CXR  Hx of HTN  Hx of CVA  Hyperglycemia Likely acute stress reaction - follow - A1c not c/w DM - would not pursue tx at this age regardless  Goals of Care Dr Dolphus Jenny confirmed the previously stated goals of care with the patient herself along with her grandson and her grandson's wife - the goal is to avoid aggressive tx and transition to comfort care only if condition were to detiorate  Code Status: NO CODE Family Communication: discussed w/ grandson  Disposition Plan: SDU  Consultants: none  Procedures: TTE - 5/30 -  moderate LVH - ejection fraction was 55% - evidence of RV volume overload - moderate and possibly severe aortic stenosis  Antibiotics: Rocephin 5/29 >> Azithro 5/30 >> Vanc 5/29  DVT prophylaxis: lovenox  HPI/Subjective: The patient is alert today but quite confused.  Objective: Blood pressure 129/56, pulse 86, temperature 97.8 F (36.6 C), temperature source Oral, resp. rate 31, height 5' (1.524 m), weight 49.3 kg (108 lb 11 oz), SpO2 96.00%.  Intake/Output Summary (Last  24 hours) at 08/06/12 1726 Last data filed at 08/06/12 1600  Gross per 24 hour  Intake    180 ml  Output   1975 ml  Net  -1795 ml   Exam: General: No evidence of respiratory distress Lungs: Fine crackles throughout all fields- no wheeze Cardiovascular: Irregularly irregular with a soft 2/6 holosystolic murmur Abdomen: Nontender, nondistended, soft, bowel sounds positive, no rebound, no ascites, no appreciable mass Extremities: No significant cyanosis, or clubbing; trace edema bilateral  lower extremities  Data Reviewed: Basic Metabolic Panel:  Recent Labs Lab 08/02/12 0552 08/03/12 0542 08/04/12 0600 08/05/12 0455 08/06/12 0440  NA 138 138 138 135 137  K 3.9 3.3* 3.4* 3.3* 3.6  CL 103 103 105 102 104  CO2 19 18* 22 24 24   GLUCOSE 156* 143* 134* 132* 123*  BUN 66* 68* 45* 25* 19  CREATININE 1.02 1.09 0.87 0.76 0.67  CALCIUM 9.2 8.8 8.6 8.2* 8.4  MG  --  2.4 2.4  --   --   PHOS  --  3.1  --   --   --    Liver Function Tests:  Recent Labs Lab 08/02/12 0552 08/03/12 0542  AST 54* 75*  ALT 81* 112*  ALKPHOS 138* 159*  BILITOT 1.3* 0.7  PROT 7.2 6.9  ALBUMIN 3.3* 3.2*   CBC:  Recent Labs Lab 08/01/12 2120 08/02/12 0552 08/03/12 0542 08/06/12 0440  WBC 15.2* 14.1* 11.7* 11.4*  NEUTROABS  --  11.3*  --   --   HGB 12.8 12.1 11.8* 11.9*  HCT 36.9 34.9* 34.8* 34.2*  MCV 91.1 90.6 90.2 89.3  PLT 280 245 258 280   Cardiac Enzymes:  Recent Labs Lab 08/01/12 2120 08/02/12 0552 08/02/12 1050 08/02/12 1707  TROPONINI <0.30 <0.30 <0.30 <0.30   Recent Results (from the past 240 hour(s))  MRSA PCR SCREENING     Status: None   Collection Time    08/02/12  2:24 AM      Result Value Range Status   MRSA by PCR NEGATIVE  NEGATIVE Final   Comment:            The GeneXpert MRSA Assay (FDA     approved for NASAL specimens     only), is one component of a     comprehensive MRSA colonization     surveillance program. It is not     intended to diagnose MRSA     infection nor to guide or     monitor treatment for     MRSA infections.  URINE CULTURE     Status: None   Collection Time    08/02/12  5:12 AM      Result Value Range Status   Specimen Description URINE, CATHETERIZED   Final   Special Requests Normal   Final   Culture  Setup Time 08/02/2012 10:19   Final   Colony Count NO GROWTH   Final   Culture NO GROWTH   Final   Report Status 08/03/2012 FINAL   Final  CULTURE, BLOOD (ROUTINE X 2)     Status: None   Collection Time    08/02/12   5:52 AM      Result Value Range Status   Specimen Description BLOOD RIGHT HAND   Final   Special Requests     Final   Value: BOTTLES DRAWN AEROBIC AND ANAEROBIC 6CC BLUE,5CC RED   Culture  Setup Time 08/02/2012 10:23   Final   Culture  Final   Value:        BLOOD CULTURE RECEIVED NO GROWTH TO DATE CULTURE WILL BE HELD FOR 5 DAYS BEFORE ISSUING A FINAL NEGATIVE REPORT   Report Status PENDING   Incomplete  CULTURE, BLOOD (ROUTINE X 2)     Status: None   Collection Time    08/02/12  5:58 AM      Result Value Range Status   Specimen Description BLOOD LEFT HAND   Final   Special Requests BOTTLES DRAWN AEROBIC ONLY 6CC   Final   Culture  Setup Time 08/02/2012 10:29   Final   Culture     Final   Value:        BLOOD CULTURE RECEIVED NO GROWTH TO DATE CULTURE WILL BE HELD FOR 5 DAYS BEFORE ISSUING A FINAL NEGATIVE REPORT   Report Status PENDING   Incomplete     Studies:  Recent x-ray studies have been reviewed in detail by the Attending Physician  Scheduled Meds:  Scheduled Meds: . aspirin EC  81 mg Oral Daily  . azithromycin  250 mg Oral Daily  . cefTRIAXone (ROCEPHIN)  IV  1 g Intravenous Q24H  . digoxin  0.125 mg Oral Daily  . diltiazem  240 mg Oral Daily  . enoxaparin (LOVENOX) injection  40 mg Subcutaneous Q24H  . furosemide  40 mg Intravenous Daily  . metoprolol tartrate  12.5 mg Oral BID  . potassium chloride  40 mEq Oral BID    Time spent on care of this patient:   [facility]  [facility] Office  [phone] Pager - Text Page per Loretha Stapler as per below:  On-Call/Text Page:      [email or web address]      password [email or web address]  If 7PM-7AM, please contact night-coverage [email or web address] Password [email or web address] 08/06/2012, 5:26 PM   LOS: 5 days

## 2012-08-06 NOTE — Progress Notes (Signed)
Foley to remain d/t urinary retention/diuresing per MD.

## 2012-08-07 MED ORDER — ENOXAPARIN SODIUM 30 MG/0.3ML ~~LOC~~ SOLN
30.0000 mg | SUBCUTANEOUS | Status: DC
  Administered 2012-08-08: 30 mg via SUBCUTANEOUS
  Filled 2012-08-07 (×2): qty 0.3

## 2012-08-07 MED ORDER — FUROSEMIDE 40 MG PO TABS
40.0000 mg | ORAL_TABLET | Freq: Two times a day (BID) | ORAL | Status: DC
  Administered 2012-08-07 – 2012-08-09 (×4): 40 mg via ORAL
  Filled 2012-08-07 (×7): qty 1

## 2012-08-07 MED ORDER — POTASSIUM CHLORIDE CRYS ER 20 MEQ PO TBCR
40.0000 meq | EXTENDED_RELEASE_TABLET | Freq: Every day | ORAL | Status: AC
  Administered 2012-08-07 – 2012-08-09 (×3): 40 meq via ORAL
  Filled 2012-08-07 (×3): qty 2

## 2012-08-07 NOTE — Care Management Note (Signed)
  Page 2 of 2   08/07/2012     3:23:03 PM   CARE MANAGEMENT NOTE 08/07/2012  Patient:  Regina Burch, Regina Burch   Account Number:  [id number]  Date Initiated:  08/06/2012  Documentation initiated by:  Donn Pierini  Subjective/Objective Assessment:   Pt admitted with afib RVR, pulm. edema with acute resp. failure- LLL CAP     Action/Plan:   PTA [place]- NCM to follow for d/c needs- CSW following for potential SNF placement   Anticipated DC Date:  08/09/2012   Anticipated DC Plan:  SKILLED NURSING FACILITY  In-house referral  Clinical Social Worker      DC Planning Services  CM consult      Choice offered to / List presented to:             Status of service:  In process, will continue to follow Medicare Important Message given?   (If response is "NO", the following Medicare IM given date fields will be blank) Date Medicare IM given:   Date Additional Medicare IM given:    Discharge Disposition:    Per UR Regulation:  Reviewed for med. necessity/level of care/duration of stay  If discussed at Long Length of Stay Meetings, dates discussed:   08/08/2012    Comments:  08-07-12 Harlene Ramus, RN,BSN [phone] Per MD notes IV ABX therapy will be d/c due to 7 day course completed. MD spoke to pt and family in reference to goals of care- the goal is to avoid aggressive tx and transition to comfort care only if condition were to deteriorate. Per MD plan is to treat medically unless the pt worsens then comfort care. CSW is working with the pt and family. CM will continue to monitor for disposition needs.   08/06/12- 1200- Donn Pierini RN, BSN [phone] Pt still requirig 6L -02- ? PC if pt does not improve - per MD note the goal is to avoid aggressive tx and transition to comfort care only if condition were to detiorate-- CSW following for possible SNF placement

## 2012-08-07 NOTE — Progress Notes (Signed)
Talked to [facility] re fall/sliding out of chair today.  Pt to [place] to 2023, family notified.

## 2012-08-07 NOTE — Progress Notes (Signed)
[facility] Progress Note [place] TEAM 1 - Stepdown/ICU TEAM   Regina Burch EAV:[id number] DOB: May 10, 1911 DOA: 08/01/2012 PCP: Caffie Damme, MD  Brief narrative: 79 y.o. female w/ past medical history of Stroke; A-fib; and Hypertension who presented with 2 days of not feeling well and increased work of breathing. Grandson checked her pulse and it was racing. She was seen by her Cardiologist and was diagnosed with A.fib and was started on metoprolol. She has hx of pacemaker. She had hx of TIA/CVA in remote past and was started on coumadin but had been off this for the past few years. She had been on aspirin 81 mg but this was stopped due to epistaxis. Denied any fever or chills, no cough.   She was taken to [facility] and was found to be in a.fib with RVR with HR in 140s. She was also noted to be hypoxic. Diltiazem was given with good results. Initial CXR showed pulmonary edema with questionable infiltrate. The repeat imaging showed improvement of edema but still possible infiltrate. Patient was transferred to [facility] stepdown.   With IV diltiazem, diuresis, and antibiotic therapy the patient has begun to stabilize.  She is now on oral medications alone.  Given the finding of aortic stenosis we continue to adjust diuretic to attempt to achieve the ideal euvolemic state. Efforts continue in regard to physical therapy and occupational therapy with the ultimate disposition likely being placement within a skilled nursing facility,  though the patient and family would prefer for her to return to an independent situation.    Assessment/Plan:  Atrial fibrillation with RVR Currently well controlled with use of Cardizem, digoxin, and beta blocker -digoxin level is at goal - it should be rechecked in 3-5 days to assure that it does not become toxic - follow rate with ongoing PT/OT  Acute hypoxic respiratory failure due to:       Pulmonary edema w/ AoS Was hoping that edema was primarily rate related  phenomenon, but echo confirms AoS as well as evidence of volume overload - cont to diurese as tolerated, with transition to oral agent today - patient is net -5.5 L - I/O, wgt, and BUN/crt will need to be followed very closely        LLL CAP (community acquired pneumonia) f/u CXR on 6/2 revealed no significant change - d/c abx today having completed a 7 day course   Elevated D-dimer Had not been stable enough to tolerate imaging for w/u - was on empiric lovenox tx initial portion of hospital stay - patient is at an exceedingly high risk of bleeding given her advanced age and frail health - her acute illness likely explains her elevated d-dimer - nonetheless she does not wish to undergo aggressive long-term care and therefore I felt it was reasonable to stop empiric anticoagulation and not pursue a pulmonary embolism workup further  Hypokalemia Cont to replete with ongoing diuretic use and follow - Mg is normal   S/P pacemaker Hx scant - pacer noted on CXR  Hx of HTN BP trending back up - follow w diuretic titration   Hx of CVA  Hyperglycemia Likely acute stress reaction - A1c not c/w DM - would not pursue tx at this age regardless  Goals of Care Dr Sharon Seller confirmed the goals of care with the patient herself along with her grandson and her grandson's wife - the goal is to avoid aggressive tx and transition to comfort care only if condition were to detiorate  Code  Status: NO CODE Family Communication: No family present at time of exam today Disposition Plan:  Transfer to telemetry bed  Consultants: none  Procedures: TTE - 5/30 -  moderate LVH - ejection fraction was 55% - evidence of RV volume overload - moderate and possibly severe aortic stenosis  Antibiotics: Rocephin 5/29 >>6/4 Azithro 5/30 >>6/4 Vanc 5/29  DVT prophylaxis: lovenox  HPI/Subjective: The patient is sitting up in a bedside chair.  She denies shortness of breath or chest pain.  She is quite somnolent but  pleasant and interactive when stimulated.  Objective: Blood pressure 148/106, pulse 104, temperature 97.7 F (36.5 C), temperature source Oral, resp. rate 38, height 5' (1.524 m), weight 49.3 kg (108 lb 11 oz), SpO2 93.00%.  Intake/Output Summary (Last 24 hours) at 08/07/12 1428 Last data filed at 08/07/12 0900  Gross per 24 hour  Intake    290 ml  Output   1475 ml  Net  -1185 ml   Exam: General: No respiratory distress Lungs: Fine crackles throughout all fields - no wheeze Cardiovascular: Irregularly irregular with a soft 2/6 holosystolic murmur Abdomen: Nontender, nondistended, soft, bowel sounds positive, no rebound, no ascites, no appreciable mass Extremities: No significant cyanosis, or clubbing; trace edema bilateral lower extremities  Data Reviewed: Basic Metabolic Panel:  Recent Labs Lab 08/02/12 0552 08/03/12 0542 08/04/12 0600 08/05/12 0455 08/06/12 0440  NA 138 138 138 135 137  K 3.9 3.3* 3.4* 3.3* 3.6  CL 103 103 105 102 104  CO2 19 18* 22 24 24   GLUCOSE 156* 143* 134* 132* 123*  BUN 66* 68* 45* 25* 19  CREATININE 1.02 1.09 0.87 0.76 0.67  CALCIUM 9.2 8.8 8.6 8.2* 8.4  MG  --  2.4 2.4  --   --   PHOS  --  3.1  --   --   --    Liver Function Tests:  Recent Labs Lab 08/02/12 0552 08/03/12 0542  AST 54* 75*  ALT 81* 112*  ALKPHOS 138* 159*  BILITOT 1.3* 0.7  PROT 7.2 6.9  ALBUMIN 3.3* 3.2*   CBC:  Recent Labs Lab 08/01/12 2120 08/02/12 0552 08/03/12 0542 08/06/12 0440  WBC 15.2* 14.1* 11.7* 11.4*  NEUTROABS  --  11.3*  --   --   HGB 12.8 12.1 11.8* 11.9*  HCT 36.9 34.9* 34.8* 34.2*  MCV 91.1 90.6 90.2 89.3  PLT 280 245 258 280   Cardiac Enzymes:  Recent Labs Lab 08/01/12 2120 08/02/12 0552 08/02/12 1050 08/02/12 1707  TROPONINI <0.30 <0.30 <0.30 <0.30   Recent Results (from the past 240 hour(s))  MRSA PCR SCREENING     Status: None   Collection Time    08/02/12  2:24 AM      Result Value Range Status   MRSA by PCR NEGATIVE   NEGATIVE Final   Comment:            The GeneXpert MRSA Assay (FDA     approved for NASAL specimens     only), is one component of a     comprehensive MRSA colonization     surveillance program. It is not     intended to diagnose MRSA     infection nor to guide or     monitor treatment for     MRSA infections.  URINE CULTURE     Status: None   Collection Time    08/02/12  5:12 AM      Result Value Range Status   Specimen Description  URINE, CATHETERIZED   Final   Special Requests Normal   Final   Culture  Setup Time 08/02/2012 10:19   Final   Colony Count NO GROWTH   Final   Culture NO GROWTH   Final   Report Status 08/03/2012 FINAL   Final  CULTURE, BLOOD (ROUTINE X 2)     Status: None   Collection Time    08/02/12  5:52 AM      Result Value Range Status   Specimen Description BLOOD RIGHT HAND   Final   Special Requests     Final   Value: BOTTLES DRAWN AEROBIC AND ANAEROBIC 6CC BLUE,5CC RED   Culture  Setup Time 08/02/2012 10:23   Final   Culture     Final   Value:        BLOOD CULTURE RECEIVED NO GROWTH TO DATE CULTURE WILL BE HELD FOR 5 DAYS BEFORE ISSUING A FINAL NEGATIVE REPORT   Report Status PENDING   Incomplete  CULTURE, BLOOD (ROUTINE X 2)     Status: None   Collection Time    08/02/12  5:58 AM      Result Value Range Status   Specimen Description BLOOD LEFT HAND   Final   Special Requests BOTTLES DRAWN AEROBIC ONLY 6CC   Final   Culture  Setup Time 08/02/2012 10:29   Final   Culture     Final   Value:        BLOOD CULTURE RECEIVED NO GROWTH TO DATE CULTURE WILL BE HELD FOR 5 DAYS BEFORE ISSUING A FINAL NEGATIVE REPORT   Report Status PENDING   Incomplete     Studies:  Recent x-ray studies have been reviewed in detail by the Attending Physician  Scheduled Meds:  Scheduled Meds: . aspirin EC  81 mg Oral Daily  . cefTRIAXone (ROCEPHIN)  IV  1 g Intravenous Q24H  . digoxin  0.125 mg Oral Daily  . diltiazem  240 mg Oral Daily  . [START ON 08/08/2012]  enoxaparin (LOVENOX) injection  30 mg Subcutaneous Q24H  . furosemide  40 mg Oral BID  . metoprolol tartrate  12.5 mg Oral BID    Time spent on care of this patient:   [facility] T  [facility] Office  [phone] Pager - Text Page per Loretha Stapler as per below:  On-Call/Text Page:      [email or web address]      password [email or web address]  If 7PM-7AM, please contact night-coverage and and [email or web address] Password [email or web address] 08/07/2012, 2:28 PM   LOS: 6 days

## 2012-08-07 NOTE — Progress Notes (Addendum)
Family wanted Pt up to chair. Explained WOB & MSO4 admin. Got Pt up for lunch. After lunch, Pt wanted to remain in chair. Table was placed across Pt lap, call bell given with instructions to call for repositioning. Another RN walked by just a few minutes later & noticed Pt sitting in floor. Dr was on unit and notified. VS taken; no new orders. Will continue to monitor.  Called family; left a msg to return call.

## 2012-08-07 NOTE — Progress Notes (Signed)
Clinical Child psychotherapist (CSW) contacted pt grandson and confirmed that grandson received pt bed offers left by CSW in pt room. CSW informed grandson that it was very important that grandson chose a facility within the next day in order for the facility to have enough time to submit for pt insurance authorization. CSW made grandson aware of the insurance policy that once pt is medically ready for discharge, the discharge would not be delayed due to pending insurance authorization. Pt grandson stated understanding and said he would visit the facilities in the next day or so. CSW to remain following.  Theresia Bough, MSW, Theresia Majors [phone]

## 2012-08-08 LAB — CULTURE, BLOOD (ROUTINE X 2): Culture: NO GROWTH

## 2012-08-08 LAB — BASIC METABOLIC PANEL
BUN: 16 mg/dL (ref 6–23)
Calcium: 9.2 mg/dL (ref 8.4–10.5)
Creatinine, Ser: 0.71 mg/dL (ref 0.50–1.10)
GFR calc Af Amer: 80 mL/min — ABNORMAL LOW (ref >90)
GFR calc non Af Amer: 69 mL/min — ABNORMAL LOW (ref >90)
Glucose, Bld: 122 mg/dL — ABNORMAL HIGH (ref 70–99)

## 2012-08-08 NOTE — Progress Notes (Signed)
Physical Therapy Treatment Patient Details Name: Regina Burch MRN: [id number] DOB: February 16, 1912 Today's Date: 08/08/2012 Time: 4098-1191 PT Time Calculation (min): 18 min  PT Assessment / Plan / Recommendation Comments on Treatment Session  Patient very pleasent but confused this morning. Pt ambulated around room to chair, did not wish to proceed further. Tolerated minimal ther-ex in chair. Patient left with nsg in room for bathing. Overall patient still requires assist for mobility and will benefit from ST SNF upon discharge.    Follow Up Recommendations  SNF     Does the patient have the potential to tolerate intense rehabilitation     Barriers to Discharge        Equipment Recommendations  None recommended by PT    Recommendations for Other Services    Frequency Min 3X/week   Plan Discharge plan remains appropriate    Precautions / Restrictions Precautions Precautions: Fall Precaution Comments: severe aortic stenosis, watch sats Restrictions Weight Bearing Restrictions: No   Pertinent Vitals/Pain No pain at this time, SpO2 on 3 liters 99%, on rm air at rest 96%, 93% seated EOB and 92% with activity.    Mobility  Bed Mobility Bed Mobility: Rolling Left;Left Sidelying to Sit;Sitting - Scoot to [place] of Bed Rolling Left: 4: Min assist;With rail Left Sidelying to Sit: 3: Mod assist;With rails Sitting - Scoot to Edge of Bed: 3: Mod assist Details for Bed Mobility Assistance: Pt required step by step VCs for body positioning and sequencing for log roll and push to sit; physical assist needed for mobility.  Transfers Transfers: Sit to Stand;Stand to Sit Sit to Stand: 4: Min assist;From bed;With upper extremity assist Stand to Sit: 4: Min assist;To chair/3-in-1 Details for Transfer Assistance: VCs for safe hand placement Ambulation/Gait Ambulation/Gait Assistance: 4: Min assist;3: Mod assist Ambulation Distance (Feet): 18 Feet Assistive device: Rolling  walker Ambulation/Gait Assistance Details: Assist for positioning and stability with rw. Pt on rm air for amulation. Did not wish to ambulate further at this time.  Gait Pattern: Step-through pattern;Decreased stride length;Shuffle;Trunk flexed Gait velocity: decreased General Gait Details: very slow and rigid with gait    Exercises General Exercises - Lower Extremity Ankle Circles/Pumps: Both;10 reps;Supine Long Arc Quad: AROM;Both;10 reps    PT Goals Acute Rehab PT Goals PT Goal Formulation: Patient unable to participate in goal setting Time For Goal Achievement: 08/18/12 Potential to Achieve Goals: Good Pt will go Supine/Side to Sit: with supervision PT Goal: Supine/Side to Sit - Progress: Progressing toward goal Pt will go Sit to Stand: with supervision PT Goal: Sit to Stand - Progress: Progressing toward goal Pt will Transfer Bed to Chair/Chair to Bed: with supervision PT Transfer Goal: Bed to Chair/Chair to Bed - Progress: Progressing toward goal Pt will Ambulate: 51 - 150 feet;with supervision;with least restrictive assistive device PT Goal: Ambulate - Progress: Progressing toward goal Pt will Perform Home Exercise Program: with supervision, verbal cues required/provided PT Goal: Perform Home Exercise Program - Progress: Progressing toward goal  Visit Information  Last PT Received On: 08/08/12 Assistance Needed: +2 (for safety)    Subjective Data  Subjective: Oh I would like to get up   Cognition  Cognition Arousal/Alertness: Awake/alert Behavior During Therapy: WFL for tasks assessed/performed Overall Cognitive Status: Impaired/Different from baseline Area of Impairment: Memory;Orientation;Problem solving Orientation Level: Disoriented to;Place;Time;Situation Memory: Decreased short-term memory Problem Solving: Slow processing;Requires verbal cues    Balance  Balance Balance Assessed: Yes Static Sitting Balance Static Sitting - Balance Support: Feet  supported;Left upper extremity  supported Static Sitting - Level of Assistance: 6: Modified independent (Device/Increase time) Static Sitting - Comment/# of Minutes: sitting on EOB for 3 minutes to monitor vitals  Static Standing Balance Static Standing - Balance Support: Bilateral upper extremity supported Static Standing - Level of Assistance: 4: Min assist  End of Session PT - End of Session Equipment Utilized During Treatment: Gait belt;Oxygen Activity Tolerance: Patient limited by fatigue Patient left: in chair;with nursing in room;with call bell/phone within reach Nurse Communication: Other (comment) (O2 sats during activity on rm air)   GP     Fabio Asa 08/08/2012, 2:23 PM Charlotte Crumb, PT DPT  [phone]

## 2012-08-08 NOTE — Clinical Social Work Note (Signed)
Clinical Social Worker continuing to follow patient and family for support and discharge planning needs.  Patient family has responded to CSW with preference to [facility].  CSW spoke with facility and confirmed patient acceptance.  Patient family has completed all paperwork at the facility and is prepared to discharge tomorrow if patient is medically ready.  CSW remains available for support and to facilitate patient discharge needs once medically ready.  Macario Golds, [place] [phone]

## 2012-08-08 NOTE — Progress Notes (Signed)
Patient ID: Regina Burch  female  NFA:[id number]    DOB: [birth year]    DOA: 08/01/2012  PCP: Caffie Damme, MD  Assessment/Plan:  Atrial fibrillation with RVR  - Currently well controlled with use of Cardizem, digoxin, and beta blocker - recheck Dig level  Acute hypoxic respiratory failure due to:  Pulmonary edema w/ AoS  - echo confirms AoS as well as evidence of volume overload  - cont to diurese as tolerated, on oral lasix - Patient still on 4 L O2 via nasal cannula, wean as tolerated  LLL CAP (community acquired pneumonia)  - f/u CXR on 6/2 revealed no significant change - Antibiotics were discontinued, completed a 7 day course   Elevated D-dimer  Had not been stable enough to tolerate imaging for w/u - was on empiric lovenox tx initial portion of hospital stay - patient is at an exceedingly high risk of bleeding given her advanced age and frail health - her acute illness likely explains her elevated d-dimer - nonetheless she does not wish to undergo aggressive long-term care and therefore I felt it was reasonable to stop empiric anticoagulation and not pursue a pulmonary embolism workup further   Dementia: likely with sundowning, PT rec'd SNF   S/P pacemaker   Hx of HTN - BP stable  Hx of CVA   DVT Prophylaxis:  Code Status: DNR  Disposition: Discussed with patient's daughter in detail over the phone    Subjective: Patient herself denies any specific complaints, no nausea, vomiting, coughing, afebrile  Objective: Weight change:   Intake/Output Summary (Last 24 hours) at 08/08/12 1227 Last data filed at 08/08/12 1100  Gross per 24 hour  Intake    240 ml  Output   1625 ml  Net  -1385 ml   Blood pressure 115/66, pulse 81, temperature 97.9 F (36.6 C), temperature source Oral, resp. rate 20, height 5' (1.524 m), weight 49.3 kg (108 lb 11 oz), SpO2 92.00%.  Physical Exam: General: Alert and awake, pleasant and conversant, not in any acute distress. CVS:  Irregularly irregular, 2 x 6 holosystolic murmur  Chest: Decreased breath sounds at the bases, no wheezing, Abdomen: soft nontender, nondistended, normal bowel sounds  Extremities: no cyanosis, clubbing or edema noted bilaterally  Lab Results: Basic Metabolic Panel:  Recent Labs Lab 08/03/12 0542 08/04/12 0600  08/06/12 0440 08/08/12 0415  NA 138 138  < > 137 134*  K 3.3* 3.4*  < > 3.6 4.0  CL 103 105  < > 104 99  CO2 18* 22  < > 24 25  GLUCOSE 143* 134*  < > 123* 122*  BUN 68* 45*  < > 19 16  CREATININE 1.09 0.87  < > 0.67 0.71  CALCIUM 8.8 8.6  < > 8.4 9.2  MG 2.4 2.4  --   --   --   PHOS 3.1  --   --   --   --   < > = values in this interval not displayed. Liver Function Tests:  Recent Labs Lab 08/02/12 0552 08/03/12 0542  AST 54* 75*  ALT 81* 112*  ALKPHOS 138* 159*  BILITOT 1.3* 0.7  PROT 7.2 6.9  ALBUMIN 3.3* 3.2*   No results found for this basename: LIPASE, AMYLASE,  in the last 168 hours No results found for this basename: AMMONIA,  in the last 168 hours CBC:  Recent Labs Lab 08/02/12 0552 08/03/12 0542 08/06/12 0440  WBC 14.1* 11.7* 11.4*  NEUTROABS 11.3*  --   --  HGB 12.1 11.8* 11.9*  HCT 34.9* 34.8* 34.2*  MCV 90.6 90.2 89.3  PLT 245 258 280   Cardiac Enzymes:  Recent Labs Lab 08/02/12 0552 08/02/12 1050 08/02/12 1707  TROPONINI <0.30 <0.30 <0.30   BNP: No components found with this basename: POCBNP,  CBG: No results found for this basename: GLUCAP,  in the last 168 hours   Micro Results: Recent Results (from the past 240 hour(s))  MRSA PCR SCREENING     Status: None   Collection Time    08/02/12  2:24 AM      Result Value Range Status   MRSA by PCR NEGATIVE  NEGATIVE Final   Comment:            The GeneXpert MRSA Assay (FDA     approved for NASAL specimens     only), is one component of a     comprehensive MRSA colonization     surveillance program. It is not     intended to diagnose MRSA     infection nor to guide or      monitor treatment for     MRSA infections.  URINE CULTURE     Status: None   Collection Time    08/02/12  5:12 AM      Result Value Range Status   Specimen Description URINE, CATHETERIZED   Final   Special Requests Normal   Final   Culture  Setup Time 08/02/2012 10:19   Final   Colony Count NO GROWTH   Final   Culture NO GROWTH   Final   Report Status 08/03/2012 FINAL   Final  CULTURE, BLOOD (ROUTINE X 2)     Status: None   Collection Time    08/02/12  5:52 AM      Result Value Range Status   Specimen Description BLOOD RIGHT HAND   Final   Special Requests     Final   Value: BOTTLES DRAWN AEROBIC AND ANAEROBIC 6CC BLUE,5CC RED   Culture  Setup Time 08/02/2012 10:23   Final   Culture NO GROWTH 5 DAYS   Final   Report Status 08/08/2012 FINAL   Final  CULTURE, BLOOD (ROUTINE X 2)     Status: None   Collection Time    08/02/12  5:58 AM      Result Value Range Status   Specimen Description BLOOD LEFT HAND   Final   Special Requests BOTTLES DRAWN AEROBIC ONLY [facility]   Final   Culture  Setup Time 08/02/2012 10:29   Final   Culture NO GROWTH 5 DAYS   Final   Report Status 08/08/2012 FINAL   Final    Studies/Results: Dg Chest Port 1 View  08/05/2012   *RADIOLOGY REPORT*  Clinical Data: Follow up infiltrate and pulmonary edema.  PORTABLE CHEST - 1 VIEW  Comparison: 08/03/2012  Findings: The cardiac shadow is stable.  A pacing device is again seen.  Interstitial edema is again identified as well as some generalized increased density in both lungs.  The overall appearance is stable.  No new focal abnormality is noted.  IMPRESSION: No significant change from prior exam.   Original Report Authenticated By: Alcide Clever, M.D.   Dg Chest Port 1 View  08/03/2012   *RADIOLOGY REPORT*  Clinical Data: Edema, infiltrate  PORTABLE CHEST - 1 VIEW  Comparison:   the previous day's study  Findings: Some increase in bilateral interstitial edema with more prominent septal lines seen peripherally.   Perihilar and  left retrocardiac airspace disease persists as.  Heart size upper limits normal.  Atheromatous aorta.  Stable left subclavian pacemaker. No definite effusion.  IMPRESSION:  1.  Increase in interstitial edema since previous exam   Original Report Authenticated By: D. Andria Rhein, MD   Dg Chest Port 1 View  08/02/2012   *RADIOLOGY REPORT*  Clinical Data: Hypoxia.  PORTABLE CHEST - 1 VIEW  Comparison: Chest radiograph performed 08/01/2012  Findings: The lungs are well-aerated.  Previously noted pulmonary edema has significantly improved, with residual increased interstitial markings seen.  Small bilateral pleural effusions are suspected.  Mild left basilar opacity may reflect edema or possibly underlying pneumonia.  No pneumothorax is identified.  The cardiomediastinal silhouette is borderline normal in size.  A pacemaker is noted overlying the left chest wall, with leads ending overlying the right atrium and right ventricle.  Calcification is noted in the aortic arch.  No acute osseous abnormalities are seen.  IMPRESSION: Significant interval improvement in pulmonary edema, with residual edema seen.  Mild left basilar opacity may reflect edema or possibly underlying pneumonia.  Small bilateral pleural effusions suspected.   Original Report Authenticated By: Tonia Ghent, M.D.   Dg Chest Port 1 View  08/01/2012   *RADIOLOGY REPORT*  Clinical Data: Chest pain and shortness of breath.  PORTABLE CHEST - 1 VIEW  Comparison: None.  Findings: The lungs are well-aerated.  Vascular congestion is noted, with diffusely increased interstitial markings and left basilar airspace opacity.  A small left pleural effusion is suspected.  Findings are concerning for pulmonary edema.  There is no evidence of pneumothorax.  The cardiomediastinal silhouette is borderline normal in size. Calcification is noted in the aortic arch.  A pacemaker is seen overlying the left chest wall, with leads ending overlying the right  atrium and right ventricle.  No acute osseous abnormalities are seen.  IMPRESSION: Vascular congestion, with diffusely increased interstitial markings and left basilar airspace opacity.  Suspect small left pleural effusion.  Findings concerning for pulmonary edema.   Original Report Authenticated By: Tonia Ghent, M.D.    Medications: Scheduled Meds: . aspirin EC  81 mg Oral Daily  . digoxin  0.125 mg Oral Daily  . diltiazem  240 mg Oral Daily  . enoxaparin (LOVENOX) injection  30 mg Subcutaneous Q24H  . furosemide  40 mg Oral BID  . metoprolol tartrate  12.5 mg Oral BID  . potassium chloride  40 mEq Oral Daily      LOS: 7 days   Ralphine Hinks M.D. [facility] 08/08/2012, 12:27 PM Pager: [phone]  If 7PM-7AM, please contact night-coverage [email or web address] Password [email or web address]

## 2012-08-09 DIAGNOSIS — R040 Epistaxis: Secondary | ICD-10-CM | POA: Diagnosis present

## 2012-08-09 MED ORDER — SALINE SPRAY 0.65 % NA SOLN: 1.0000 | NASAL | Status: DC | PRN

## 2012-08-09 MED ORDER — OXYMETAZOLINE HCL 0.05 % NA SOLN: 1.0000 | Freq: Two times a day (BID) | NASAL | Status: DC

## 2012-08-09 MED ORDER — ASPIRIN 81 MG PO TBEC: 81.0000 mg | DELAYED_RELEASE_TABLET | Freq: Every day | ORAL | Status: DC

## 2012-08-09 MED ORDER — OXYMETAZOLINE HCL 0.05 % NA SOLN
1.0000 | Freq: Two times a day (BID) | NASAL | Status: DC
  Administered 2012-08-09: 1 via NASAL
  Filled 2012-08-09: qty 15

## 2012-08-09 MED ORDER — FUROSEMIDE 40 MG PO TABS: 40.0000 mg | ORAL_TABLET | Freq: Every day | ORAL | Status: DC

## 2012-08-09 MED ORDER — DIGOXIN 125 MCG PO TABS: 0.1250 mg | ORAL_TABLET | Freq: Every day | ORAL | Status: DC

## 2012-08-09 MED ORDER — CLONAZEPAM 0.5 MG PO TABS: 0.2500 mg | ORAL_TABLET | Freq: Two times a day (BID) | ORAL | Status: DC | PRN

## 2012-08-09 MED ORDER — METOPROLOL TARTRATE 12.5 MG HALF TABLET: 12.5000 mg | ORAL_TABLET | Freq: Two times a day (BID) | ORAL | Status: DC

## 2012-08-09 MED ORDER — DILTIAZEM HCL ER COATED BEADS 240 MG PO CP24: 240.0000 mg | ORAL_CAPSULE | Freq: Every day | ORAL | Status: DC

## 2012-08-09 MED ORDER — SALINE SPRAY 0.65 % NA SOLN
1.0000 | NASAL | Status: DC | PRN
  Filled 2012-08-09: qty 44

## 2012-08-09 MED ORDER — LIDOCAINE-EPINEPHRINE 1 %-1:100000 IJ SOLN
10.0000 mL | Freq: Once | INTRAMUSCULAR | Status: AC
  Administered 2012-08-09: 10 mL
  Filled 2012-08-09: qty 10

## 2012-08-09 NOTE — Clinical Social Work Note (Signed)
Clinical Social Worker facilitated patient discharge including contacting patient family and facility to confirm patient discharge plans.  Clinical information faxed to facility and family agreeable with plan.  CSW arranged ambulance transport via [facility] to [facility].  RN to call report prior to discharge.  Clinical Social Worker will sign off for now as social work intervention is no longer needed. Please consult us again if new need arises.  Jesse Daveena Elmore, LCSW [phone] 

## 2012-08-09 NOTE — Consult Note (Signed)
Reason for Consult: Nosebleed Referring Physician: Cathren Harsh, MD  Regina Burch is an 79 y.o. female.  HPI: She had nasal prong oxygen until late last night. She started having bleeding from the right side today. She's had some problems in the past.  Past Medical History  Diagnosis Date  . Stroke   . A-fib   . Hypertension     Past Surgical History  Procedure Laterality Date  . Pacemaker insertion      Family History  Problem Relation Age of Onset  . Stroke Mother     Social History:  reports that she has never smoked. She does not have any smokeless tobacco history on file. She reports that she does not drink alcohol or use illicit drugs.  Allergies: No Known Allergies  Medications: Reviewed  Results for orders placed during the hospital encounter of 08/01/12 (from the past 48 hour(s))  BASIC METABOLIC PANEL     Status: Abnormal   Collection Time    08/08/12  4:15 AM      Result Value Range   Sodium 134 (*) 135 - 145 mEq/L   Potassium 4.0  3.5 - 5.1 mEq/L   Chloride 99  96 - 112 mEq/L   CO2 25  19 - 32 mEq/L   Glucose, Bld 122 (*) 70 - 99 mg/dL   BUN 16  6 - 23 mg/dL   Creatinine, Ser 7.84  0.50 - 1.10 mg/dL   Calcium 9.2  8.4 - 69.6 mg/dL   GFR calc non Af Amer 69 (*) >90 mL/min   GFR calc Af Amer 80 (*) >90 mL/min   Comment:            The eGFR has been calculated     using the CKD EPI equation.     This calculation has not been     validated in all clinical     situations.     eGFR's persistently     <90 mL/min signify     possible Chronic Kidney Disease.  DIGOXIN LEVEL     Status: None   Collection Time    08/08/12  8:15 AM      Result Value Range   Digoxin Level 0.9  0.8 - 2.0 ng/mL  PRO B NATRIURETIC PEPTIDE     Status: Abnormal   Collection Time    08/08/12  8:15 AM      Result Value Range   Pro B Natriuretic peptide (BNP) 3902.0 (*) 0 - 450 pg/mL    No results found.  EXB:[id number] except as listed in admit H&P  Blood pressure  117/53, pulse 123, temperature 98.2 F (36.8 C), temperature source Oral, resp. rate 18, height 5' (1.524 m), weight 108 lb 11 oz (49.3 kg), SpO2 94.00%.  PHYSICAL EXAM: Overall appearance:  Healthy appearing, in no distress Head:  Normocephalic, atraumatic. Ears: External ears normal. Nose: External nose is healthy in appearance. Internal nasal exam reveals a small granuloma/ulceration of the right anterior/inferior septum. Oral Cavity:  There are no mucosal lesions or masses identified. Oral Pharynx/Hypopharynx/Larynx: no signs of any mucosal lesions or masses identified.  Neuro:  No identifiable neurologic deficits. Neck: No palpable neck masses.  Studies Reviewed: none  Procedures: The right anterior septum was anesthetized with topical 4% Xylocaine. Silver nitrate cautery was performed. There was no further bleeding. There was mild bleeding during the procedure. She tolerated this well.   Assessment/Plan: The right anterior septal lesion was cauterized with silver nitrate. Recommend saline  nasal spray several times daily. Follow up when necessary  Hakiem Malizia 08/09/2012, 11:37 AM

## 2012-08-09 NOTE — Discharge Summary (Signed)
Physician Discharge Summary  Patient ID: Regina Burch MRN: [id number] DOB/AGE: 77-Aug-1913 77 y.o.  Admit date: 08/01/2012 Discharge date: 08/09/2012  Primary Care Physician:  Caffie Damme, MD  Discharge Diagnoses:    . Pulmonary edema . Atrial fibrillation with RVR . CAP (community acquired pneumonia) . Hypokalemia . Epistaxis  Consults: ENT, Dr. Pollyann Kennedy   Recommendations for Outpatient Follow-up:  1) The right anterior septal lesion was cauterized with silver nitrate. Recommend saline nasal spray several times daily. Follow up when necessary with ENT. 2) metoprolol can be titrated up as tolerated if resting HR >100 3) please stop aspirin 81 mg if patient has any repeat epistaxis   Allergies:  No Known Allergies   Discharge Medications:   Medication List    STOP taking these medications       amLODipine 5 MG tablet  Commonly known as:  NORVASC     TOPROL XL 50 MG 24 hr tablet  Generic drug:  metoprolol succinate      TAKE these medications       aspirin 81 MG EC tablet  Take 1 tablet (81 mg total) by mouth daily.     cholecalciferol 1000 UNITS tablet  Commonly known as:  VITAMIN D  Take 1,000 Units by mouth daily.     clonazePAM 0.5 MG tablet  Commonly known as:  KLONOPIN  Take 0.5 tablets (0.25 mg total) by mouth 2 (two) times daily as needed for anxiety.     digoxin 0.125 MG tablet  Commonly known as:  LANOXIN  Take 1 tablet (0.125 mg total) by mouth daily.     diltiazem 240 MG 24 hr capsule  Commonly known as:  CARDIZEM CD  Take 1 capsule (240 mg total) by mouth daily.     furosemide 40 MG tablet  Commonly known as:  LASIX  Take 1 tablet (40 mg total) by mouth daily.     metoprolol tartrate 12.5 mg Tabs  Commonly known as:  LOPRESSOR  Take 0.5 tablets (12.5 mg total) by mouth 2 (two) times daily.     oxymetazoline 0.05 % nasal spray  Commonly known as:  AFRIN  Place 1 spray into the nose 2 (two) times daily. For 3 days     sodium chloride  0.65 % Soln nasal spray  Commonly known as:  OCEAN  Place 1 spray into the nose as needed for congestion. Please use several times in a day for next 3-4 days         Brief H and P: For complete details please refer to admission H and P, but in briefMadeline Judie Petit Burch is a 79 y.o. female  has a past medical history of Stroke; A-fib; and Hypertension. presented with  2 days of not feeling well and increased work of breathing. Grand-son checked her pulse and it was racing. She was seen yesterday by her cardiologist and was diagnosed with A.fib and was started on metoprolol. She has hx of pacemaker. She have had hx of TIA/CVA in remote past and ws started on coumadin but have been off this for the past few years. She has been on aspirin 81 mg but this was stopped due to epistaxis. Denies any fever or chills, no cough.  She was brought to [facility] and was found to be in a.fib with RVR with HR in 140's she was also noted to be hypoxic. Diltiazem was given with good results. Initial CXR showed pulmonary edema with questionable infiltrate. The repeat imaging showing improvement of  edema but still possible infiltrate. Patient was admitted to [facility] stepdown for further management.  Hospital Course:  Atrial fibrillation with RVR With IV diltiazem, diuresis, and antibiotic therapy the patient stabilized well.  She is now on oral medications alone. Currently well controlled with use of Cardizem, digoxin, and beta blocker. Digoxin level was 0.9 at discharge. Metoprolol can be titrated up as tolerated if resting HR >100. Patient was not placed on anticoagulation due to her age, fall risk and history of epistaxis even with aspirin. She also had epistaxis during the hospitalization prior to discharge.  Acute hypoxic respiratory failure due to: RESOLVED, currently O2 sats 94% on room air Pulmonary edema w/ AoS  - echo confirms AoS as well as evidence of volume overload, continue oral Lasix. O2 weaned off.   LLL  CAP (community acquired pneumonia)  - f/u CXR on 6/2 revealed no significant change  - Antibiotics were discontinued, completed a 7 day course   EPISTAXIS: On the morning of 08/09/12, patient had bleeding from the right side, she has had problems with epistaxis in the past. She had nasal prong oxygen until last night for last 8 days. The epistaxis stopped spontaneously with pressure. ENT was consulted,the right anterior septal lesion was cauterized with silver nitrate. Recommend saline nasal spray several times daily. Follow up when necessary with Dr Pollyann Kennedy.  Elevated D-dimer  Had not been stable enough to tolerate imaging for w/u - was on empiric lovenox tx initial portion of hospital stay - patient is at an exceedingly high risk of bleeding given her advanced age and frail health - her acute illness likely explains her elevated d-dimer - nonetheless she did not wish to undergo aggressive long-term care and therefore it was felt reasonable to stop empiric anticoagulation and not pursue a pulmonary embolism workup further   Dementia: likely with sundowning, PT rec'd SNF  S/P pacemaker  Hx of HTN - BP stable  Hx of CVA - currently patient is on aspirin 81 mg daily due to new diagnosis of atrial fibrillation with RVR. If patient has any recurrent epistaxis, it can be discontinued with understanding risk of having TIA/CVA.   Day of Discharge BP 117/53  Pulse 123  Temp(Src) 98.2 F (36.8 C) (Oral)  Resp 18  Ht 5' (1.524 m)  Wt 49.3 kg (108 lb 11 oz)  BMI 21.23 kg/m2  SpO2 94%  Physical Exam:  General: Alert and awake, pleasant and conversant, not in any acute distress.  CVS: Irregularly irregular, 2 x 6 holosystolic murmur  Chest: Decreased breath sounds at the bases, no wheezing,  Abdomen: soft nontender, nondistended, normal bowel sounds  Extremities: no cyanosis, clubbing or edema noted bilaterally   The results of significant diagnostics from this hospitalization (including imaging,  microbiology, ancillary and laboratory) are listed below for reference.    LAB RESULTS: Basic Metabolic Panel:  Recent Labs Lab 08/03/12 0542 08/04/12 0600  08/06/12 0440 08/08/12 0415  NA 138 138  < > 137 134*  K 3.3* 3.4*  < > 3.6 4.0  CL 103 105  < > 104 99  CO2 18* 22  < > 24 25  GLUCOSE 143* 134*  < > 123* 122*  BUN 68* 45*  < > 19 16  CREATININE 1.09 0.87  < > 0.67 0.71  CALCIUM 8.8 8.6  < > 8.4 9.2  MG 2.4 2.4  --   --   --   PHOS 3.1  --   --   --   --   < > =  values in this interval not displayed. Liver Function Tests:  Recent Labs Lab 08/03/12 0542  AST 75*  ALT 112*  ALKPHOS 159*  BILITOT 0.7  PROT 6.9  ALBUMIN 3.2*   No results found for this basename: LIPASE, AMYLASE,  in the last 168 hours No results found for this basename: AMMONIA,  in the last 168 hours CBC:  Recent Labs Lab 08/03/12 0542 08/06/12 0440  WBC 11.7* 11.4*  HGB 11.8* 11.9*  HCT 34.8* 34.2*  MCV 90.2 89.3  PLT 258 280   Cardiac Enzymes:  Recent Labs Lab 08/02/12 1707  TROPONINI <0.30   BNP: No components found with this basename: POCBNP,  CBG: No results found for this basename: GLUCAP,  in the last 168 hours  Significant Diagnostic Studies:  Dg Chest Port 1 View  08/02/2012   *RADIOLOGY REPORT*  Clinical Data: Hypoxia.  PORTABLE CHEST - 1 VIEW  Comparison: Chest radiograph performed 08/01/2012  Findings: The lungs are well-aerated.  Previously noted pulmonary edema has significantly improved, with residual increased interstitial markings seen.  Small bilateral pleural effusions are suspected.  Mild left basilar opacity may reflect edema or possibly underlying pneumonia.  No pneumothorax is identified.  The cardiomediastinal silhouette is borderline normal in size.  A pacemaker is noted overlying the left chest wall, with leads ending overlying the right atrium and right ventricle.  Calcification is noted in the aortic arch.  No acute osseous abnormalities are seen.   IMPRESSION: Significant interval improvement in pulmonary edema, with residual edema seen.  Mild left basilar opacity may reflect edema or possibly underlying pneumonia.  Small bilateral pleural effusions suspected.   Original Report Authenticated By: Tonia Ghent, M.D.   Dg Chest Port 1 View  08/01/2012   *RADIOLOGY REPORT*  Clinical Data: Chest pain and shortness of breath.  PORTABLE CHEST - 1 VIEW  Comparison: None.  Findings: The lungs are well-aerated.  Vascular congestion is noted, with diffusely increased interstitial markings and left basilar airspace opacity.  A small left pleural effusion is suspected.  Findings are concerning for pulmonary edema.  There is no evidence of pneumothorax.  The cardiomediastinal silhouette is borderline normal in size. Calcification is noted in the aortic arch.  A pacemaker is seen overlying the left chest wall, with leads ending overlying the right atrium and right ventricle.  No acute osseous abnormalities are seen.  IMPRESSION: Vascular congestion, with diffusely increased interstitial markings and left basilar airspace opacity.  Suspect small left pleural effusion.  Findings concerning for pulmonary edema.   Original Report Authenticated By: Tonia Ghent, M.D.    2D ECHO: Study Conclusions  - Left ventricle: The cavity size was normal. Wall thickness was increased in a pattern of moderate LVH. The estimated ejection fraction was 55%. Indeterminant diastolic function (atrial fibrillation). Although no diagnostic regional wall motion abnormality was identified, this possibility cannot be completely excluded on the basis of this study. - Ventricular septum: The interventricular septum is shifted to the left in diastole, suggesting RV volume overload. - Aortic valve: Trileaflet; severely calcified leaflets. Trivial regurgitation. Visually, there appeared to be at least moderate and possibly severe aortic stenosis. By gradient, AS was in the mild  range. - Mitral valve: Moderately to severely calcified annulus. There was no evidence for stenosis. Moderate regurgitation. - Left atrium: The atrium was moderately dilated. - Right ventricle: The cavity size was mildly dilated. Pacer wire or catheter noted in right ventricle. Systolic function was normal. - Right atrium: The atrium was moderately dilated. -  Atrial septum: No defect or patent foramen ovale was identified. Echo contrast study showed no right-to-left atrial level shunt, at baseline or with provocation. - Tricuspid valve: Peak RV-RA gradient:50mm Hg (S). - Pulmonary arteries: PA systolic pressure 37-41 mmHg. - Systemic veins: IVC measured 2.3 cm with minimal respirophasic variation, suggesting RA pressure 16-20 mmHg.    Disposition and Follow-up: Discharge Orders   Future Orders Complete By Expires     (HEART FAILURE PATIENTS) Call MD:  Anytime you have any of the following symptoms: 1) 3 pound weight gain in 24 hours or 5 pounds in 1 week 2) shortness of breath, with or without a dry hacking cough 3) swelling in the hands, feet or stomach 4) if you have to sleep on extra pillows at night in order to breathe.  As directed     Diet - low sodium heart healthy  As directed     Discharge instructions  As directed     Scheduling Instructions:      Recommend saline nasal spray several times daily. Follow up when necessary with ENT.    Comments:      please    Increase activity slowly  As directed         DISPOSITION: Skilled nursing facility DIET: *Heart healthy diet  ACTIVITY: As tolerated TESTS THAT NEED FOLLOW-UP  BMET, Dig level next week  DISCHARGE FOLLOW-UP Follow-up Information   Schedule an appointment as soon as possible for a visit with Serena Colonel, MD. (As needed)    Contact information:   [address] [place] [address] [phone]       Follow up with Caffie Damme, MD. Schedule an  appointment as soon as possible for a visit in 10 days. (For hospital followup)    Contact information:   [address] [place] [address] [phone]       Time spent on Discharge: 45 mins  Signed:   Bellina Tokarczyk M.D. [facility] 08/09/2012, 11:58 AM Pager: [phone]

## 2012-12-07 ENCOUNTER — Encounter (HOSPITAL_COMMUNITY): Payer: Self-pay | Admitting: Emergency Medicine

## 2012-12-07 ENCOUNTER — Inpatient Hospital Stay (HOSPITAL_COMMUNITY)
Admission: EM | Admit: 2012-12-07 | Discharge: 2012-12-10 | DRG: 304 | Disposition: A | Discharge status: Nursing Home (Includes ICF) | Payer: Medicare Other | Attending: Internal Medicine | Admitting: Internal Medicine

## 2012-12-07 ENCOUNTER — Emergency Department (HOSPITAL_COMMUNITY): Payer: Medicare Other

## 2012-12-07 DIAGNOSIS — I6529 Occlusion and stenosis of unspecified carotid artery: Secondary | ICD-10-CM | POA: Diagnosis present

## 2012-12-07 DIAGNOSIS — M1990 Unspecified osteoarthritis, unspecified site: Secondary | ICD-10-CM | POA: Diagnosis present

## 2012-12-07 DIAGNOSIS — I359 Nonrheumatic aortic valve disorder, unspecified: Secondary | ICD-10-CM | POA: Diagnosis present

## 2012-12-07 DIAGNOSIS — R040 Epistaxis: Secondary | ICD-10-CM

## 2012-12-07 DIAGNOSIS — I5033 Acute on chronic diastolic (congestive) heart failure: Secondary | ICD-10-CM | POA: Diagnosis present

## 2012-12-07 DIAGNOSIS — F0390 Unspecified dementia without behavioral disturbance: Secondary | ICD-10-CM | POA: Diagnosis present

## 2012-12-07 DIAGNOSIS — J189 Pneumonia, unspecified organism: Secondary | ICD-10-CM

## 2012-12-07 DIAGNOSIS — J811 Chronic pulmonary edema: Secondary | ICD-10-CM

## 2012-12-07 DIAGNOSIS — R55 Syncope and collapse: Secondary | ICD-10-CM | POA: Diagnosis present

## 2012-12-07 DIAGNOSIS — I10 Essential (primary) hypertension: Principal | ICD-10-CM

## 2012-12-07 DIAGNOSIS — Z23 Encounter for immunization: Secondary | ICD-10-CM

## 2012-12-07 DIAGNOSIS — E785 Hyperlipidemia, unspecified: Secondary | ICD-10-CM | POA: Diagnosis present

## 2012-12-07 DIAGNOSIS — I350 Nonrheumatic aortic (valve) stenosis: Secondary | ICD-10-CM | POA: Diagnosis present

## 2012-12-07 DIAGNOSIS — Z950 Presence of cardiac pacemaker: Secondary | ICD-10-CM | POA: Diagnosis present

## 2012-12-07 DIAGNOSIS — N390 Urinary tract infection, site not specified: Secondary | ICD-10-CM | POA: Diagnosis present

## 2012-12-07 DIAGNOSIS — Z823 Family history of stroke: Secondary | ICD-10-CM

## 2012-12-07 DIAGNOSIS — I4891 Unspecified atrial fibrillation: Secondary | ICD-10-CM

## 2012-12-07 DIAGNOSIS — E876 Hypokalemia: Secondary | ICD-10-CM | POA: Diagnosis present

## 2012-12-07 DIAGNOSIS — I160 Hypertensive urgency: Secondary | ICD-10-CM | POA: Diagnosis present

## 2012-12-07 DIAGNOSIS — Z8673 Personal history of transient ischemic attack (TIA), and cerebral infarction without residual deficits: Secondary | ICD-10-CM

## 2012-12-07 DIAGNOSIS — R0902 Hypoxemia: Secondary | ICD-10-CM

## 2012-12-07 DIAGNOSIS — Z79899 Other long term (current) drug therapy: Secondary | ICD-10-CM

## 2012-12-07 DIAGNOSIS — Z66 Do not resuscitate: Secondary | ICD-10-CM | POA: Diagnosis present

## 2012-12-07 DIAGNOSIS — I509 Heart failure, unspecified: Secondary | ICD-10-CM | POA: Diagnosis present

## 2012-12-07 HISTORY — DX: Occlusion and stenosis of unspecified carotid artery: I65.29

## 2012-12-07 HISTORY — DX: Hyperlipidemia, unspecified: E78.5

## 2012-12-07 HISTORY — DX: Nonrheumatic aortic (valve) insufficiency: I35.1

## 2012-12-07 HISTORY — DX: Unspecified osteoarthritis, unspecified site: M19.90

## 2012-12-07 HISTORY — DX: Unspecified dementia, unspecified severity, without behavioral disturbance, psychotic disturbance, mood disturbance, and anxiety: F03.90

## 2012-12-07 LAB — BASIC METABOLIC PANEL
BUN: 19 mg/dL (ref 6–23)
CO2: 25 mEq/L (ref 19–32)
Calcium: 9.2 mg/dL (ref 8.4–10.5)
Chloride: 106 mEq/L (ref 96–112)
Creatinine, Ser: 0.67 mg/dL (ref 0.50–1.10)
GFR calc Af Amer: 81 mL/min — ABNORMAL LOW (ref >90)
GFR calc non Af Amer: 70 mL/min — ABNORMAL LOW (ref >90)
Glucose, Bld: 123 mg/dL — ABNORMAL HIGH (ref 70–99)
Potassium: 3.8 mEq/L (ref 3.5–5.1)
Sodium: 144 mEq/L (ref 135–145)

## 2012-12-07 LAB — CBC WITH DIFFERENTIAL/PLATELET
Basophils Absolute: 0.1 10*3/uL (ref 0.0–0.1)
Basophils Relative: 1 % (ref 0–1)
Eosinophils Absolute: 0.1 10*3/uL (ref 0.0–0.7)
Eosinophils Relative: 1 % (ref 0–5)
HCT: 38.8 % (ref 36.0–46.0)
Hemoglobin: 13.6 g/dL (ref 12.0–15.0)
Lymphocytes Relative: 19 % (ref 12–46)
Lymphs Abs: 1.6 10*3/uL (ref 0.7–4.0)
MCH: 30.8 pg (ref 26.0–34.0)
MCHC: 35.1 g/dL (ref 30.0–36.0)
MCV: 87.8 fL (ref 78.0–100.0)
Monocytes Absolute: 0.6 10*3/uL (ref 0.1–1.0)
Monocytes Relative: 7 % (ref 3–12)
Neutro Abs: 6.4 10*3/uL (ref 1.7–7.7)
Neutrophils Relative %: 73 % (ref 43–77)
Platelets: 272 10*3/uL (ref 150–400)
RBC: 4.42 MIL/uL (ref 3.87–5.11)
RDW: 14.9 % (ref 11.5–15.5)
WBC: 8.8 10*3/uL (ref 4.0–10.5)

## 2012-12-07 LAB — URINALYSIS, ROUTINE W REFLEX MICROSCOPIC
Bilirubin Urine: NEGATIVE
Glucose, UA: NEGATIVE mg/dL
Hgb urine dipstick: NEGATIVE
Ketones, ur: NEGATIVE mg/dL
Nitrite: NEGATIVE
Protein, ur: NEGATIVE mg/dL
Specific Gravity, Urine: 1.023 (ref 1.005–1.030)
Urobilinogen, UA: 1 mg/dL (ref 0.0–1.0)
pH: 7 (ref 5.0–8.0)

## 2012-12-07 LAB — DIGOXIN LEVEL: Digoxin Level: 1 ng/mL (ref 0.8–2.0)

## 2012-12-07 LAB — URINE MICROSCOPIC-ADD ON

## 2012-12-07 LAB — TROPONIN I: Troponin I: <0.3 ng/mL (ref <0.30)

## 2012-12-07 LAB — PRO B NATRIURETIC PEPTIDE: Pro B Natriuretic peptide (BNP): 3128 pg/mL — ABNORMAL HIGH (ref 0–450)

## 2012-12-07 MED ORDER — DONEPEZIL HCL 5 MG PO TABS
5.0000 mg | ORAL_TABLET | Freq: Every day | ORAL | Status: DC
  Administered 2012-12-07 – 2012-12-09 (×3): 5 mg via ORAL
  Filled 2012-12-07 (×4): qty 1

## 2012-12-07 MED ORDER — FUROSEMIDE 10 MG/ML IJ SOLN
60.0000 mg | Freq: Once | INTRAMUSCULAR | Status: AC
  Administered 2012-12-07: 60 mg via INTRAVENOUS
  Filled 2012-12-07: qty 6

## 2012-12-07 MED ORDER — CITALOPRAM HYDROBROMIDE 10 MG PO TABS
10.0000 mg | ORAL_TABLET | Freq: Every day | ORAL | Status: DC
  Administered 2012-12-08 – 2012-12-10 (×3): 10 mg via ORAL
  Filled 2012-12-07 (×4): qty 1

## 2012-12-07 MED ORDER — DILTIAZEM HCL ER COATED BEADS 240 MG PO CP24
240.0000 mg | ORAL_CAPSULE | ORAL | Status: DC
  Administered 2012-12-08 – 2012-12-10 (×2): 240 mg via ORAL
  Filled 2012-12-07 (×2): qty 1

## 2012-12-07 MED ORDER — DILTIAZEM HCL ER 120 MG PO CP24
120.0000 mg | ORAL_CAPSULE | ORAL | Status: DC
  Administered 2012-12-09: 120 mg via ORAL
  Filled 2012-12-07: qty 1

## 2012-12-07 MED ORDER — NITROGLYCERIN IN D5W 200-5 MCG/ML-% IV SOLN
2.0000 ug/min | Freq: Once | INTRAVENOUS | Status: AC
  Administered 2012-12-07: 5 ug/min via INTRAVENOUS
  Filled 2012-12-07: qty 250

## 2012-12-07 MED ORDER — ACETAMINOPHEN 325 MG PO TABS: 650.0000 mg | ORAL_TABLET | ORAL | Status: DC | PRN

## 2012-12-07 MED ORDER — LORATADINE 10 MG PO TABS
10.0000 mg | ORAL_TABLET | Freq: Every day | ORAL | Status: DC | PRN
  Filled 2012-12-07: qty 1

## 2012-12-07 MED ORDER — HEPARIN SODIUM (PORCINE) 5000 UNIT/ML IJ SOLN
5000.0000 [IU] | Freq: Three times a day (TID) | INTRAMUSCULAR | Status: DC
  Administered 2012-12-07 – 2012-12-09 (×7): 5000 [IU] via SUBCUTANEOUS
  Filled 2012-12-07 (×11): qty 1

## 2012-12-07 MED ORDER — SODIUM CHLORIDE 0.9 % IJ SOLN
3.0000 mL | Freq: Two times a day (BID) | INTRAMUSCULAR | Status: DC
  Administered 2012-12-07 – 2012-12-08 (×2): 3 mL via INTRAVENOUS

## 2012-12-07 MED ORDER — LISINOPRIL 2.5 MG PO TABS
2.5000 mg | ORAL_TABLET | Freq: Every day | ORAL | Status: DC
  Administered 2012-12-07 – 2012-12-09 (×3): 2.5 mg via ORAL
  Filled 2012-12-07 (×3): qty 1

## 2012-12-07 MED ORDER — SERTRALINE HCL 25 MG PO TABS
25.0000 mg | ORAL_TABLET | Freq: Every day | ORAL | Status: DC
  Administered 2012-12-08 – 2012-12-10 (×3): 25 mg via ORAL
  Filled 2012-12-07 (×4): qty 1

## 2012-12-07 MED ORDER — VITAMIN D3 25 MCG (1000 UNIT) PO TABS
1000.0000 [IU] | ORAL_TABLET | Freq: Every day | ORAL | Status: DC
  Administered 2012-12-07 – 2012-12-10 (×4): 1000 [IU] via ORAL
  Filled 2012-12-07 (×4): qty 1

## 2012-12-07 MED ORDER — SODIUM CHLORIDE 0.9 % IV SOLN: 250.0000 mL | INTRAVENOUS | Status: DC | PRN

## 2012-12-07 MED ORDER — TAB-A-VITE/IRON PO TABS
1.0000 | ORAL_TABLET | Freq: Every day | ORAL | Status: DC
  Administered 2012-12-08 – 2012-12-10 (×3): 1 via ORAL
  Filled 2012-12-07 (×5): qty 1

## 2012-12-07 MED ORDER — FUROSEMIDE 10 MG/ML IJ SOLN
40.0000 mg | Freq: Two times a day (BID) | INTRAMUSCULAR | Status: DC
  Administered 2012-12-08: 40 mg via INTRAVENOUS
  Filled 2012-12-07 (×3): qty 4

## 2012-12-07 MED ORDER — SODIUM CHLORIDE 0.9 % IV SOLN: Freq: Once | INTRAVENOUS | Status: DC

## 2012-12-07 MED ORDER — CLONAZEPAM 0.5 MG PO TABS
0.2500 mg | ORAL_TABLET | Freq: Every day | ORAL | Status: DC
  Administered 2012-12-07 – 2012-12-09 (×3): 0.25 mg via ORAL
  Filled 2012-12-07 (×3): qty 1

## 2012-12-07 MED ORDER — METOPROLOL TARTRATE 12.5 MG HALF TABLET
12.5000 mg | ORAL_TABLET | Freq: Two times a day (BID) | ORAL | Status: DC
  Administered 2012-12-07 – 2012-12-10 (×6): 12.5 mg via ORAL
  Filled 2012-12-07 (×7): qty 1

## 2012-12-07 MED ORDER — DIGOXIN 125 MCG PO TABS
0.1250 mg | ORAL_TABLET | ORAL | Status: DC
Start: 2012-12-09 — End: 2012-12-10
  Administered 2012-12-09: 0.125 mg via ORAL
  Filled 2012-12-07: qty 1

## 2012-12-07 MED ORDER — ASPIRIN 81 MG PO CHEW
81.0000 mg | CHEWABLE_TABLET | Freq: Every day | ORAL | Status: DC
  Administered 2012-12-07 – 2012-12-10 (×4): 81 mg via ORAL
  Filled 2012-12-07 (×4): qty 1

## 2012-12-07 MED ORDER — ONDANSETRON HCL 4 MG/2ML IJ SOLN: 4.0000 mg | Freq: Four times a day (QID) | INTRAMUSCULAR | Status: DC | PRN

## 2012-12-07 MED ORDER — SODIUM CHLORIDE 0.9 % IJ SOLN: 3.0000 mL | INTRAMUSCULAR | Status: DC | PRN

## 2012-12-07 NOTE — ED Notes (Signed)
Patient returned from X-ray 

## 2012-12-07 NOTE — ED Notes (Signed)
From [facility] assisted living; reported felt ill, vomited x1, and then passed out. Found supine in floor at her doorway. Brought in on backboard, cleared off by [employer] and [employer]. Patient reports feeling dizzy trying to walk with her walker; she reports no pain.

## 2012-12-07 NOTE — ED Notes (Signed)
Patient transported to X-ray 

## 2012-12-07 NOTE — ED Provider Notes (Signed)
CSN: [id number]     Arrival date & time 12/07/12  1748 History   First MD Initiated Contact with Patient 12/07/12 1758     No chief complaint on file.  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 Had syncopal event just before arrival. Coming from [employer]. Was ambulating with walker when she felt sick to her stomach and vomited. The next thing she remembers is waking up on the ground. Witnessed event. Reportedly had a brief loss of consciousness. No incontinence or tongue biting. Quick return to her baseline. Patient currently with no complaints. Denies pain anywhere currently.  She denies any preceding chest pain, palpitations, diaphoresis or shortness of breath. Had been feeling fine earlier in the day. No urinary complaints. No BRBPR or melena.   Past Medical History  Diagnosis Date  . Stroke   . A-fib   . Hypertension   . Aortic regurgitation   . Carotid stenosis   . Degenerative joint disease   . Hyperlipemia   . Senile dementia    Past Surgical History  Procedure Laterality Date  . Pacemaker insertion     Family History  Problem Relation Age of Onset  . Stroke Mother    History  Substance Use Topics  . Smoking status: Never Smoker   . Smokeless tobacco: Not on file  . Alcohol Use: No   OB History   Grav Para Term Preterm Abortions TAB SAB Ect Mult Living                 Review of Systems  All systems reviewed and negative, other than as noted in HPI.   Allergies  Review of patient's allergies indicates no known allergies.  Home Medications   Current Outpatient Rx  Name  Route  Sig  Dispense  Refill  . aspirin EC 81 MG EC tablet   Oral   Take 1 tablet (81 mg total) by mouth daily.         . cholecalciferol (VITAMIN D) 1000 UNITS tablet   Oral   Take 1,000 Units by mouth daily.         . clonazePAM (KLONOPIN)  0.5 MG tablet   Oral   Take 0.5 tablets (0.25 mg total) by mouth 2 (two) times daily as needed for anxiety.   30 tablet   0   . digoxin (LANOXIN) 0.125 MG tablet   Oral   Take 1 tablet (0.125 mg total) by mouth daily.         Marland Kitchen diltiazem (CARDIZEM CD) 240 MG 24 hr capsule   Oral   Take 1 capsule (240 mg total) by mouth daily.         . furosemide (LASIX) 40 MG tablet   Oral   Take 1 tablet (40 mg total) by mouth daily.   30 tablet      . metoprolol tartrate (LOPRESSOR) 12.5 mg TABS   Oral   Take 0.5 tablets (12.5 mg total) by mouth 2 (two) times daily.          BP 184/94  Pulse 70  Resp 17  Ht 5\' 2"  (1.575 m)  Wt 110 lb (49.896 kg)  BMI 20.11 kg/m2  SpO2 86% Physical Exam  Nursing note and vitals reviewed. Constitutional: She appears well-developed and well-nourished. No distress.  HENT:  Head: Normocephalic and atraumatic.  Eyes: Conjunctivae are normal. Right eye exhibits no discharge. Left eye exhibits no  discharge.  Neck: Neck supple.  Cardiovascular: Normal rate.  Exam reveals no gallop and no friction rub.   Murmur heard. Irregularly irregular rhythm. Loud systolic murmur.  Pulmonary/Chest: Effort normal and breath sounds normal. No respiratory distress.  Abdominal: Soft. She exhibits no distension. There is no tenderness.  Musculoskeletal: She exhibits no edema and no tenderness.  Neurological: She is alert.  disoriented to time. Speech is clear and content is mostly appropriate. Following commands. No focal motor deficits.  Skin: Skin is warm and dry.  Psychiatric: She has a normal mood and affect. Her behavior is normal. Thought content normal.    ED Course  Procedures (including critical care time) Labs Review Labs Reviewed  BASIC METABOLIC PANEL - Abnormal; Notable for the following:    Glucose, Bld 123 (*)    GFR calc non Af Amer 70 (*)    GFR calc Af Amer 81 (*)    All other components within normal limits  URINALYSIS, ROUTINE W REFLEX  MICROSCOPIC - Abnormal; Notable for the following:    APPearance TURBID (*)    Leukocytes, UA MODERATE (*)    All other components within normal limits  PRO B NATRIURETIC PEPTIDE - Abnormal; Notable for the following:    Pro B Natriuretic peptide (BNP) 3128.0 (*)    All other components within normal limits  URINE MICROSCOPIC-ADD ON - Abnormal; Notable for the following:    Squamous Epithelial / LPF MANY (*)    Bacteria, UA FEW (*)    All other components within normal limits  URINE CULTURE  CBC WITH DIFFERENTIAL  DIGOXIN LEVEL  TROPONIN I  BASIC METABOLIC PANEL  TROPONIN I  TROPONIN I   EKG:  Rhythm: afib  Vent. rate 74 BPM PR interval * Regina QRS duration 86 Regina QT/QTc 354/393 Regina ST segments:NS ST changes   Imaging Review Dg Chest 2 View  12/07/2012   CLINICAL DATA:  Hypoxia.  EXAM: CHEST - 2 VIEW  COMPARISON:  08/05/2012  FINDINGS: There is evidence of moderate CHF. No significant pleural fluid is identified. There is a stable appearance to a dual chambered pacemaker. The heart size is normal. The visualized skeletal structures are unremarkable.  IMPRESSION: Moderate congestive heart failure.   Electronically Signed   By: Irish Lack M.D.   On: 12/07/2012 19:34    MDM   1. Heart failure   2. Hypertension   3. Hypoxemia   4. A-fib     79 year old female presenting after syncopal event. On exam she is fairly tachypneic. Also hypoxic on room air. Chest x-ray consistent with heart failure. Likely from severe hypertension in conjunction of what seems like significant aortic stenosis. Harsh murmur on exam . Nitroglycerin drip started. Diuresis. Atrial fibrillation which is chronic. She is currently rate controlled. Digoxin level therapeutic. Had echocardiogram in May. It showed ventricular wall thickness consistent with left ventricular hypertrophy. Ejection fraction was estimated at 55%. Diastolic function was indeterminate secondary to her atrial fibrillation. Moderate to  severe aortic stenosis.  Unclear of exact etiology of syncope. EKG with afib. Monitor showing occasional paced beats. No  Anemia. Possibly vagal with antecedent vomiting. Discussed with medicine for admission.    Raeford Razor, MD 12/07/12 [id number]

## 2012-12-07 NOTE — H&P (Signed)
[facility] History and Physical  Regina Burch ZOX:[id number] DOB: [birth year] DOA: 12/07/2012  Referring physician: ED PCP: Caffie Damme, MD  Chief Complaint: HTN urgency  HPI: Regina Burch is a 79 y.o. female who presents to the ED after becoming ill, vomiting x1 and passing out at her ALF.  She was extremely dizzy after the syncopal episode when she tried to walk with her walker.  She was brought to the ED after the fall.  Thankfully the patient is uninjured, work up in the ED shows initial vital signs with SBP > 200, her CXR demonstrates moderate CHF.  Nitro GTT started, lasix given, and hospitalist asked to admit.  Review of Systems: 12 systems reviewed and otherwise negative.  Past Medical History  Diagnosis Date  . Stroke   . A-fib   . Hypertension   . Aortic regurgitation   . Carotid stenosis   . Degenerative joint disease   . Hyperlipemia   . Senile dementia    Past Surgical History  Procedure Laterality Date  . Pacemaker insertion     Social History:  reports that she has never smoked. She does not have any smokeless tobacco history on file. She reports that she does not drink alcohol or use illicit drugs.  No Known Allergies  Family History  Problem Relation Age of Onset  . Stroke Mother      Prior to Admission medications   Medication Sig Start Date End Date Taking? Authorizing Provider  aspirin 81 MG chewable tablet Chew 81 mg by mouth daily.   Yes Historical Provider, MD  cholecalciferol (VITAMIN D) 1000 UNITS tablet Take 1,000 Units by mouth daily.   Yes Historical Provider, MD  citalopram (CELEXA) 10 MG tablet Take 10 mg by mouth daily.   Yes Historical Provider, MD  clonazePAM (KLONOPIN) 0.5 MG tablet Take 0.25 mg by mouth at bedtime.   Yes Historical Provider, MD  digoxin (LANOXIN) 0.125 MG tablet Take 0.125 mg by mouth every Monday, Wednesday, and Friday.   Yes Historical Provider, MD  diltiazem (CARDIZEM CD) 240 MG 24 hr capsule Take 240 mg  by mouth every other day. Alternates with diltiazem 120mg  24h capsule   Yes Historical Provider, MD  diltiazem (DILACOR XR) 120 MG 24 hr capsule Take 120 mg by mouth every other day. Alternated with diltiazem 24 hr 240 mg capsule   Yes Historical Provider, MD  donepezil (ARICEPT) 5 MG tablet Take 5 mg by mouth at bedtime.   Yes Historical Provider, MD  furosemide (LASIX) 20 MG tablet Take 20 mg by mouth daily.   Yes Historical Provider, MD  loratadine (CLARITIN) 10 MG tablet Take 10 mg by mouth daily as needed for allergies.   Yes Historical Provider, MD  metoprolol tartrate (LOPRESSOR) 12.5 mg TABS Take 0.5 tablets (12.5 mg total) by mouth 2 (two) times daily. 08/09/12  Yes Ripudeep Jenna Luo, MD  Multiple Vitamins-Iron (MULTIVITAMINS WITH IRON) TABS tablet Take 1 tablet by mouth daily.   Yes Historical Provider, MD  sertraline (ZOLOFT) 25 MG tablet Take 25 mg by mouth daily.   Yes Historical Provider, MD   Physical Exam: Filed Vitals:   12/07/12 1957  BP: 205/75  Pulse: 80  Temp:   Resp: 20    General:  NAD, resting comfortably in bed Eyes: PEERLA EOMI ENT: mucous membranes moist Neck: supple w/o JVD Cardiovascular: IRR IRR rhythm, loud systolic murmur Respiratory: CTA B Abdomen: soft, nt, nd, bs+ Skin: no rash nor lesion Musculoskeletal: MAE, full  ROM all 4 extremities Psychiatric: normal tone and affect Neurologic: disoriented to time, grossly non-focal  Labs on Admission:  Basic Metabolic Panel:  Recent Labs Lab 12/07/12 1830  NA 144  K 3.8  CL 106  CO2 25  GLUCOSE 123*  BUN 19  CREATININE 0.67  CALCIUM 9.2   Liver Function Tests: No results found for this basename: AST, ALT, ALKPHOS, BILITOT, PROT, ALBUMIN,  in the last 168 hours No results found for this basename: LIPASE, AMYLASE,  in the last 168 hours No results found for this basename: AMMONIA,  in the last 168 hours CBC:  Recent Labs Lab 12/07/12 1830  WBC 8.8  NEUTROABS 6.4  HGB 13.6  HCT 38.8  MCV 87.8   PLT 272   Cardiac Enzymes:  Recent Labs Lab 12/07/12 1830  TROPONINI <0.30    BNP (last 3 results)  Recent Labs  08/08/12 0815 12/07/12 1830  PROBNP 3902.0* 3128.0*   CBG: No results found for this basename: GLUCAP,  in the last 168 hours  Radiological Exams on Admission: Dg Chest 2 View  12/07/2012   CLINICAL DATA:  Hypoxia.  EXAM: CHEST - 2 VIEW  COMPARISON:  08/05/2012  FINDINGS: There is evidence of moderate CHF. No significant pleural fluid is identified. There is a stable appearance to a dual chambered pacemaker. The heart size is normal. The visualized skeletal structures are unremarkable.  IMPRESSION: Moderate congestive heart failure.   Electronically Signed   By: Irish Lack M.D.   On: 12/07/2012 19:34    EKG: Independently reviewed.  Assessment/Plan Principal Problem:   Hypertensive urgency Active Problems:   Pulmonary edema   1. HTN urgency - treating with nitro gtt per protocol, goal BP < 180 or resolution of symptoms, also starting patient on home meds as well as minimum dose ACEi. 2. Pulmonary edema - lasix 40mg  Q12H, got first dose in ED, 3. Syncope - most likely vasovagal, patient does have AS murmur, calcified aortic valve on last echo earlier this year gradiant suggested this was mild but visual observation of valve looked severe on that echo.  Most likely this will end up being a mild AS, given the fact that her 79 year old heart is able to generate a SBP of 200; however, will go ahead and order 2d echo repeat to re-evaluate this, if 2d echo is again poor quality then feel the risks of TEE probably outweigh benefits in this 79 year old patient.    Code Status: DNR (must indicate code status--if unknown or must be presumed, indicate so) Family Communication: No family in room (indicate person spoken with, if applicable, with phone number if by telephone) Disposition Plan: Admit to  (indicate anticipated LOS)  Time spent: 70 min  Caroline Longie  M. [facility] Pager [phone]  If 7PM-7AM, please contact night-coverage [email or web address] Password [facility] 12/07/2012, 8:32 PM

## 2012-12-07 NOTE — ED Notes (Signed)
Patient had large bowel movement and urinated in bedpan.

## 2012-12-08 DIAGNOSIS — E876 Hypokalemia: Secondary | ICD-10-CM

## 2012-12-08 LAB — GLUCOSE, CAPILLARY
Glucose-Capillary: 105 mg/dL — ABNORMAL HIGH (ref 70–99)
Glucose-Capillary: 107 mg/dL — ABNORMAL HIGH (ref 70–99)

## 2012-12-08 LAB — BASIC METABOLIC PANEL
BUN: 17 mg/dL (ref 6–23)
CO2: 27 mEq/L (ref 19–32)
Chloride: 103 mEq/L (ref 96–112)
Creatinine, Ser: 0.74 mg/dL (ref 0.50–1.10)
GFR calc Af Amer: 79 mL/min — ABNORMAL LOW (ref >90)
GFR calc non Af Amer: 68 mL/min — ABNORMAL LOW (ref >90)
Potassium: 3.3 mEq/L — ABNORMAL LOW (ref 3.5–5.1)

## 2012-12-08 LAB — TROPONIN I
Troponin I: <0.3 ng/mL (ref <0.30)
Troponin I: <0.3 ng/mL (ref <0.30)

## 2012-12-08 LAB — MRSA PCR SCREENING: MRSA by PCR: NEGATIVE

## 2012-12-08 MED ORDER — FUROSEMIDE 20 MG PO TABS
20.0000 mg | ORAL_TABLET | Freq: Every day | ORAL | Status: DC
  Administered 2012-12-08 – 2012-12-10 (×3): 20 mg via ORAL
  Filled 2012-12-08 (×3): qty 1

## 2012-12-08 MED ORDER — CEFTRIAXONE SODIUM 1 G IJ SOLR
1.0000 g | INTRAMUSCULAR | Status: DC
  Administered 2012-12-08 – 2012-12-10 (×3): 1 g via INTRAVENOUS
  Filled 2012-12-08 (×3): qty 10

## 2012-12-08 MED ORDER — POTASSIUM CHLORIDE CRYS ER 20 MEQ PO TBCR
40.0000 meq | EXTENDED_RELEASE_TABLET | Freq: Once | ORAL | Status: AC
  Administered 2012-12-08: 40 meq via ORAL
  Filled 2012-12-08: qty 2

## 2012-12-08 NOTE — Progress Notes (Signed)
Pt unco-operative, trying to climbed out of bed. For safety reasons, patient was placed in G/Chair and taken to the nursing station where she could be closely monitored.

## 2012-12-08 NOTE — Progress Notes (Signed)
[facility] Progress Note [place] TEAM 1 - Stepdown/ICU TEAM   Regina Burch ZOX:[id number] DOB: Jan 12, 1912 DOA: 12/07/2012 PCP: Caffie Damme, MD  Admit HPI / Brief Narrative: 79 y.o. female who presented to the ED after vomiting x1 and passing out at her ALF. She was extremely dizzy after the syncopal episode when she tried to walk with her walker. She was brought to the ED after the fall. Thankfully the patient was uninjured.  Work up in the ED revealed a SBP > 200.  CXR demonstrated moderate CHF. Nitro GTT started, lasix given, and Hospitalist asked to admit.   Assessment/Plan:   Malingnant HTN BP much improved at this time - take care to avoid overcorrection - weaned off nitro gtt - cont home meds and follow   Pulmonary edema sats 100% on 2L Amanda only - avoid further diuresis at this time, apart from usual home schedule   Syncope  Likely vaso-vagal in setting of nausea/vomiting (likely due to UTI) in pt w/ AoS  +UA - probable UTI Await culture - empiric abx for now   Chronic Afib  Rate controlled at this time - not felt to be anticoag candidate due to advanced age and high fall risk  Mild hypokalemia Replace - check Mg  Hx of CVA  AoS Not a candidate for intervention - manage volume carefully   S/P pacemaker  Hx scant - pacer noted on CXR  Dementia Not agitated at present   Code Status: NO CODE Family Communication: spoke w/ grandson at bedside Disposition Plan: SDU  Consultants: none  Procedures: none  Antibiotics: Rocephin 10/5 >>  DVT prophylaxis: SQ heparin  HPI/Subjective: The patient is alert and interactive this morning.  She feels much better.  She does not specifically remember the events just prior to her syncope.  She denies current chest pain fevers chills nausea or vomiting.  She does report that she feels very weak and is afraid to attempt to walk due to fear of falling.  Objective: Blood pressure 142/90, pulse 71, temperature  96.3 F (35.7 C), temperature source Axillary, resp. rate 14, height 4\' 11"  (1.499 m), weight 43.6 kg (96 lb 1.9 oz), SpO2 100.00%.  Intake/Output Summary (Last 24 hours) at 12/08/12 1035 Last data filed at 12/08/12 0300  Gross per 24 hour  Intake 243.03 ml  Output   2201 ml  Net -1957.97 ml   Exam: General: No acute respiratory distress Lungs: Clear to auscultation bilaterally without wheezes or crackles Cardiovascular: Regular rate without gallop or rub - 2/6 holosystolic murmur Abdomen: Nontender, nondistended, soft, bowel sounds positive, no rebound, no ascites, no appreciable mass Extremities: No significant cyanosis, clubbing, or edema bilateral lower extremities  Data Reviewed: Basic Metabolic Panel:  Recent Labs Lab 12/07/12 1830 12/08/12 0540  NA 144 143  K 3.8 3.3*  CL 106 103  CO2 25 27  GLUCOSE 123* 93  BUN 19 17  CREATININE 0.67 0.74  CALCIUM 9.2 8.9   CBC:  Recent Labs Lab 12/07/12 1830  WBC 8.8  NEUTROABS 6.4  HGB 13.6  HCT 38.8  MCV 87.8  PLT 272   Cardiac Enzymes:  Recent Labs Lab 12/07/12 1830 12/08/12 0158 12/08/12 0540  TROPONINI <0.30 <0.30 <0.30   BNP (last 3 results)  Recent Labs  08/08/12 0815 12/07/12 1830  PROBNP 3902.0* 3128.0*    Recent Results (from the past 240 hour(s))  MRSA PCR SCREENING     Status: None   Collection Time    12/07/12  10:00 PM      Result Value Range Status   MRSA by PCR NEGATIVE  NEGATIVE Final   Comment:            The GeneXpert MRSA Assay (FDA     approved for NASAL specimens     only), is one component of a     comprehensive MRSA colonization     surveillance program. It is not     intended to diagnose MRSA     infection nor to guide or     monitor treatment for     MRSA infections.     Studies:  Recent x-ray studies have been reviewed in detail by the Attending Physician  Scheduled Meds:  Scheduled Meds: . aspirin  81 mg Oral Daily  . cholecalciferol  1,000 Units Oral Daily   . citalopram  10 mg Oral Daily  . clonazePAM  0.25 mg Oral QHS  . [START ON 12/09/2012] digoxin  0.125 mg Oral Q M,W,F  . diltiazem  240 mg Oral QODAY  . [START ON 12/09/2012] diltiazem  120 mg Oral QODAY  . donepezil  5 mg Oral QHS  . furosemide  40 mg Intravenous Q12H  . heparin  5,000 Units Subcutaneous Q8H  . lisinopril  2.5 mg Oral Daily  . metoprolol tartrate  12.5 mg Oral BID  . multivitamins with iron  1 tablet Oral Daily  . sertraline  25 mg Oral Daily  . sodium chloride  3 mL Intravenous Q12H    Time spent on care of this patient: 35 mins   [facility] T  [facility] Office  [phone] Pager - Text Page per Loretha Stapler as per below:  On-Call/Text Page:      [email or web address]      password [email or web address]  If 7PM-7AM, please contact night-coverage [email or web address] Password [email or web address] 12/08/2012, 10:35 AM   LOS: 1 day

## 2012-12-09 LAB — BASIC METABOLIC PANEL
BUN: 23 mg/dL (ref 6–23)
Chloride: 100 mEq/L (ref 96–112)
Creatinine, Ser: 0.84 mg/dL (ref 0.50–1.10)
GFR calc Af Amer: 64 mL/min — ABNORMAL LOW (ref >90)
GFR calc non Af Amer: 56 mL/min — ABNORMAL LOW (ref >90)

## 2012-12-09 LAB — URINE CULTURE: Colony Count: >=100000

## 2012-12-09 LAB — GLUCOSE, CAPILLARY
Glucose-Capillary: 116 mg/dL — ABNORMAL HIGH (ref 70–99)
Glucose-Capillary: 113 mg/dL — ABNORMAL HIGH (ref 70–99)
Glucose-Capillary: 109 mg/dL — ABNORMAL HIGH (ref 70–99)

## 2012-12-09 MED ORDER — LISINOPRIL 5 MG PO TABS
5.0000 mg | ORAL_TABLET | Freq: Every day | ORAL | Status: DC
  Administered 2012-12-10: 5 mg via ORAL
  Filled 2012-12-09: qty 1

## 2012-12-09 MED ORDER — POTASSIUM CHLORIDE CRYS ER 20 MEQ PO TBCR
40.0000 meq | EXTENDED_RELEASE_TABLET | Freq: Once | ORAL | Status: AC
  Administered 2012-12-09: 40 meq via ORAL
  Filled 2012-12-09: qty 2

## 2012-12-09 NOTE — Evaluation (Signed)
Occupational Therapy Evaluation Patient Details Name: Regina Burch MRN: [id number] DOB: Apr 09, 1911 Today's Date: 12/09/2012 Time: 4098-1191 OT Time Calculation (min): 20 min  OT Assessment / Plan / Recommendation History of present illness Pt admitted after vomiting, passing out and awakening with dizziness.  Thought to be a vasovagual response.  Pt lives at [facility] and has baseline dementia.   Clinical Impression   Pt presents with balance deficits and baseline dementia impeding ability to perform ADL.  Is likely near her baseline.  Will follow acutely to decrease burden of care and fall risk upon return to ALF.    OT Assessment  Patient needs continued OT Services    Follow Up Recommendations  Supervision/Assistance - 24 hour;Home health OT (at ALF)    Barriers to Discharge      Equipment Recommendations  None recommended by OT    Recommendations for Other Services    Frequency  Min 2X/week    Precautions / Restrictions Precautions Precautions: Fall Restrictions Weight Bearing Restrictions: No   Pertinent Vitals/Pain VSS, no pain    ADL  Eating/Feeding: Set up Where Assessed - Eating/Feeding: Bed level Grooming: Wash/dry hands;Wash/dry face;Minimal assistance Where Assessed - Grooming: Supported standing Upper Body Bathing: Minimal assistance Where Assessed - Upper Body Bathing: Unsupported sitting Lower Body Bathing: Minimal assistance Where Assessed - Lower Body Bathing: Unsupported sitting;Supported sit to stand Upper Body Dressing: Minimal assistance Where Assessed - Upper Body Dressing: Unsupported sitting Lower Body Dressing: Minimal assistance Where Assessed - Lower Body Dressing: Supported sit to stand Toilet Transfer: Minimal assistance Toilet Transfer Method: Sit to stand Equipment Used: Gait belt;Rolling walker Transfers/Ambulation Related to ADLs: Pt requires min to mod assist to ambulate, tends to veer toward the L withou self  correction.    OT Diagnosis: Generalized weakness;Cognitive deficits  OT Problem List: Impaired balance (sitting and/or standing);Decreased cognition;Decreased safety awareness OT Treatment Interventions: Self-care/ADL training;DME and/or AE instruction;Balance training   OT Goals(Current goals can be found in the care plan section) Acute Rehab OT Goals Patient Stated Goal: return to [facility] OT Goal Formulation: Patient unable to participate in goal setting Time For Goal Achievement: 12/23/12 Potential to Achieve Goals: Good ADL Goals Pt Will Perform Grooming: with supervision;standing (x 3 activities) Pt Will Perform Upper Body Bathing: with supervision;sitting Pt Will Perform Lower Body Bathing: with supervision;sit to/from stand Pt Will Perform Upper Body Dressing: with supervision;sitting Pt Will Perform Lower Body Dressing: with supervision;sit to/from stand Pt Will Transfer to Toilet: with supervision;regular height toilet;ambulating Pt Will Perform Toileting - Clothing Manipulation and hygiene: with supervision;sit to/from stand  Visit Information  Last OT Received On: 12/09/12 Assistance Needed: +1 PT/OT Co-Evaluation/Treatment: Yes History of Present Illness: Pt admitted after vomiting, passing out and awakening with dizziness.  Thought to be a vasovagual response.  Pt lives at [facility]F and has baseline dementia.       Prior Functioning     Home Living Family/patient expects to be discharged to:: Assisted living Home Equipment: Walker - 4 wheels Additional Comments: Pt reports that she walked from her living area to the dining room with her rollator by herself PTA. Prior Function Level of Independence: Needs assistance ADL's / Homemaking Assistance Needed: supervised for bathing and dressing per RN Communication Communication: Other (comment) (low volume) Dominant Hand: Right         Vision/Perception Vision - History Baseline Vision: Wears  glasses all the time Patient Visual Report: No change from baseline   Cognition  Cognition  Arousal/Alertness: Awake/alert Behavior During Therapy: WFL for tasks assessed/performed Overall Cognitive Status: History of cognitive impairments - at baseline    Extremity/Trunk Assessment Upper Extremity Assessment Upper Extremity Assessment: Overall WFL for tasks assessed Lower Extremity Assessment Lower Extremity Assessment: Defer to PT evaluation     Mobility Bed Mobility Bed Mobility: Supine to Sit;Sitting - Scoot to Edge of Bed Supine to Sit: 5: Supervision;With rails Sitting - Scoot to Edge of Bed: 5: Supervision;With rail Details for Bed Mobility Assistance: extra time required Transfers Transfers: Sit to Stand;Stand to Sit Sit to Stand: 4: Min assist;With upper extremity assist;From bed Stand to Sit: 4: Min assist;With upper extremity assist;To chair/3-in-1 Details for Transfer Assistance: pt with posterior lean     Exercise     Balance Balance Balance Assessed: Yes Static Sitting Balance Static Sitting - Balance Support: Feet supported;No upper extremity supported Static Sitting - Level of Assistance: 5: Stand by assistance Static Standing Balance Static Standing - Balance Support: No upper extremity supported;During functional activity Static Standing - Level of Assistance: 4: Min assist Static Standing - Comment/# of Minutes: 2   End of Session OT - End of Session Activity Tolerance: Patient tolerated treatment well Patient left: in chair;with call bell/phone within reach;with nursing/sitter in room Nurse Communication: Mobility status  GO     Evern Bio 12/09/2012, 9:55 AM [phone]

## 2012-12-09 NOTE — Progress Notes (Signed)
[facility] Progress Note [place] TEAM 1 - Stepdown/ICU TEAM   Regina Burch VHQ:[id number] DOB: May 28, 1911 DOA: 12/07/2012 PCP: Caffie Damme, MD  Admit HPI / Brief Narrative: 79 y.o. female who presented to the ED after vomiting x1 and passing out at her ALF. She was extremely dizzy after the syncopal episode when she tried to walk with her walker. She was brought to the ED after the fall. Thankfully the patient was uninjured.  Work up in the ED revealed a SBP > 200.  CXR demonstrated moderate CHF. Nitro GTT started, lasix given, and Hospitalist asked to admit.   Assessment/Plan:   Malingnant HTN BP fluctuating significantly - take care to avoid overcorrection - off nitro gtt - cont home meds and follow - goal is to avoid sx (i.e. Pulmonary edema) due to uncontrolled HTN, while also avoiding over aggressive correction and resultant orthostasis/sycnope   Pulmonary edema No longer requiring supplemental O2 - avoid further diuresis at this time, apart from usual home schedule   Syncope  Likely vaso-vagal in setting of nausea/vomiting (likely due to UTI) in pt w/ AoS - tolerted ambulation w/ PT today  +UA - probable UTI Await culture results - empiric abx continue   Chronic Afib  Rate controlled at this time - not felt to be anticoag candidate due to advanced age and high fall risk  Mild hypokalemia Mg is normal - cont to replace K to goal of >3.5  Hx of CVA  AoS Not a candidate for intervention - manage volume carefully   S/P pacemaker  Hx scant - pacer noted on CXR  Dementia Not agitated at present   Code Status: NO CODE Family Communication: spoke w/ granddgtr via phone  Disposition Plan: hopeful for d/c in next 24hrs - will need to consider escalating to SNF level per PT/OT eval, though family strongly desire return to prior familiar setting   Consultants: none  Procedures: none  Antibiotics: Rocephin 10/5 >>  DVT prophylaxis: SQ  heparin  HPI/Subjective: The patient has no complaints today.  She does report feeling weak, but denies cp, sob, f/c, n/v, or abdom pain.    Objective: Blood pressure 123/69, pulse 68, temperature 98.1 F (36.7 C), temperature source Oral, resp. rate 21, height 4\' 11"  (1.499 m), weight 43.6 kg (96 lb 1.9 oz), SpO2 98.00%.  Intake/Output Summary (Last 24 hours) at 12/09/12 1402 Last data filed at 12/09/12 1104  Gross per 24 hour  Intake    660 ml  Output    601 ml  Net     59 ml   Exam: General: No acute respiratory distress at rest Lungs: Clear to auscultation bilaterally without wheezes or crackles Cardiovascular: Regular rate without gallop or rub - 2/6 holosystolic murmur Abdomen: Nontender, nondistended, soft, bowel sounds positive, no rebound, no ascites, no appreciable mass Extremities: No significant cyanosis, clubbing, or edema bilateral lower extremities  Data Reviewed: Basic Metabolic Panel:  Recent Labs Lab 12/07/12 1830 12/08/12 0540 12/09/12 0450  NA 144 143 139  K 3.8 3.3* 3.3*  CL 106 103 100  CO2 25 27 29   GLUCOSE 123* 93 86  BUN 19 17 23   CREATININE 0.67 0.74 0.84  CALCIUM 9.2 8.9 9.0  MG  --   --  1.9   CBC:  Recent Labs Lab 12/07/12 1830  WBC 8.8  NEUTROABS 6.4  HGB 13.6  HCT 38.8  MCV 87.8  PLT 272   Cardiac Enzymes:  Recent Labs Lab 12/07/12 1830 12/08/12 0158  12/08/12 0540  TROPONINI <0.30 <0.30 <0.30   BNP (last 3 results)  Recent Labs  08/08/12 0815 12/07/12 1830  PROBNP 3902.0* 3128.0*    Recent Results (from the past 240 hour(s))  MRSA PCR SCREENING     Status: None   Collection Time    12/07/12 10:00 PM      Result Value Range Status   MRSA by PCR NEGATIVE  NEGATIVE Final   Comment:            The GeneXpert MRSA Assay (FDA     approved for NASAL specimens     only), is one component of a     comprehensive MRSA colonization     surveillance program. It is not     intended to diagnose MRSA     infection nor  to guide or     monitor treatment for     MRSA infections.     Studies:  Recent x-ray studies have been reviewed in detail by the Attending Physician  Scheduled Meds:  Scheduled Meds: . aspirin  81 mg Oral Daily  . cefTRIAXone (ROCEPHIN)  IV  1 g Intravenous Q24H  . cholecalciferol  1,000 Units Oral Daily  . citalopram  10 mg Oral Daily  . clonazePAM  0.25 mg Oral QHS  . digoxin  0.125 mg Oral Q M,W,F  . diltiazem  240 mg Oral QODAY  . diltiazem  120 mg Oral QODAY  . donepezil  5 mg Oral QHS  . furosemide  20 mg Oral Daily  . heparin  5,000 Units Subcutaneous Q8H  . lisinopril  2.5 mg Oral Daily  . metoprolol tartrate  12.5 mg Oral BID  . multivitamins with iron  1 tablet Oral Daily  . sertraline  25 mg Oral Daily    Time spent on care of this patient: 35 mins   [facility] T  [facility] Office  [phone] Pager - Text Page per Loretha Stapler as per below:  On-Call/Text Page:      [email or web address]      password [email or web address]  If 7PM-7AM, please contact night-coverage [email or web address] Password [email or web address] 12/09/2012, 2:02 PM   LOS: 2 days

## 2012-12-09 NOTE — Clinical Social Work Psychosocial (Signed)
Clinical Social Work Department BRIEF PSYCHOSOCIAL ASSESSMENT 12/09/2012  Patient:  Regina Burch, Regina Burch     Account Number:  [id number]     Admit date:  12/07/2012  Clinical Social Worker:  Varney Biles  Date/Time:  12/09/2012 11:08 AM  Referred by:  Physician  Date Referred:  12/09/2012 Referred for  ALF Placement   Other Referral:   Interview type:  Patient Other interview type:    PSYCHOSOCIAL DATA Living Status:  FACILITY Admitted from facility:  [facility] Level of care:  Assisted Living Primary support name:  De Hollingshead Primary support relationship to patient:  FAMILY Degree of support available:   Good--pt from [facility]. Pt's son De Hollingshead listed in Epic as pt's primary contact.    CURRENT CONCERNS Current Concerns  Post-Acute Placement   Other Concerns:    SOCIAL WORK ASSESSMENT / PLAN Pt expressed that she was very tired when CSW met with her this morning. CSW verified that pt is from [facility], and that pt will be going back when she is medically ready for d/c. Pt fell asleep during conversation with CSW, so CSW called pt's grandson Gerlene Burdock to obtain additional information about pt and to update him concerning his grandmother's hospital admission. CSW left Richard a Teacher, adult education herself and inviting him to call to verify that he would like pt to return to [facility] upon d/c; CSW also invited Richard to call if he has any questions.   Assessment/plan status:  Psychosocial Support/Ongoing Assessment of Needs Other assessment/ plan:   Information/referral to community resources:   ALF Science writer).    PATIENT'S/FAMILY'S RESPONSE TO PLAN OF CARE: Pt was receptive to CSW visit, but was very tired and fell asleep during conversation. CSW left pt's grandson, Gerlene Burdock, a voicemail introducing herself and inviting him to call to verify pt is from [facility] and to answer any questions Gerlene Burdock  might have.       Maryclare Labrador, MSW, [facility] Clinical Social Worker [phone]

## 2012-12-09 NOTE — Progress Notes (Signed)
Physical Therapy Evaluation Patient Details Name: Regina Burch MRN: [id number] DOB: 04-03-1911 Today's Date: 12/09/2012 Time: 8469-6295 PT Time Calculation (min): 17 min  PT Assessment / Plan / Recommendation History of Present Illness  Pt admitted after vomiting, passing out and awakening with dizziness.  Thought to be a vasovagual response.  Pt lives at [facility] and has baseline dementia.  Clinical Impression  Pt admitted with above. Pt currently with functional limitations due to the deficits listed below (see PT Problem List). Pt may need to go to higher level of care for therapy prior to d/c back to assisted living facility unless ALF can provide min assist for a short time when pt first returns.   Pt will benefit from skilled PT to increase their independence and safety with mobility to allow discharge to the venue listed below.     PT Assessment  Patient needs continued PT services    Follow Up Recommendations  SNF;Supervision/Assistance - 24 hour        Barriers to Discharge Decreased caregiver support      Equipment Recommendations  None recommended by PT         Frequency Min 3X/week    Precautions / Restrictions Precautions Precautions: Fall Restrictions Weight Bearing Restrictions: No   Pertinent Vitals/Pain VSS, no pain      Mobility  Bed Mobility Bed Mobility: Supine to Sit;Sitting - Scoot to Edge of Bed Supine to Sit: 5: Supervision;With rails Sitting - Scoot to Edge of Bed: 5: Supervision;With rail Details for Bed Mobility Assistance: extra time required Transfers Transfers: Sit to Stand;Stand to Sit Sit to Stand: 4: Min assist;With upper extremity assist;From bed Stand to Sit: 4: Min assist;With upper extremity assist;To chair/3-in-1 Details for Transfer Assistance: pt with posterior lean Ambulation/Gait Ambulation/Gait Assistance: 4: Min assist Ambulation Distance (Feet): 350 Feet Assistive device: Rolling walker Ambulation/Gait  Assistance Details: Pt ambulated with RW with min assist as pt leans posteriorly and veers to left with ambulation needing constant min assist for ambulation for safety.  Pt with poor balance reactions and cannot accept challenges to balance.   Gait Pattern: Step-through pattern;Decreased stride length;Narrow base of support;Trunk flexed;Shuffle Gait velocity: decreased Stairs: No Wheelchair Mobility Wheelchair Mobility: No         PT Diagnosis: Generalized weakness  PT Problem List: Decreased activity tolerance;Decreased balance;Decreased mobility;Decreased cognition;Decreased knowledge of use of DME;Decreased safety awareness;Decreased knowledge of precautions PT Treatment Interventions: DME instruction;Gait training;Functional mobility training;Therapeutic activities;Therapeutic exercise;Balance training;Patient/family education     PT Goals(Current goals can be found in the care plan section) Acute Rehab PT Goals Patient Stated Goal: return to [facility] PT Goal Formulation: With patient Time For Goal Achievement: 12/23/12 Potential to Achieve Goals: Good  Visit Information  Last PT Received On: 12/09/12 Assistance Needed: +1 PT/OT Co-Evaluation/Treatment: Yes History of Present Illness: Pt admitted after vomiting, passing out and awakening with dizziness.  Thought to be a vasovagual response.  Pt lives at [facility] and has baseline dementia.       Prior Functioning  Home Living Family/patient expects to be discharged to:: Assisted living Home Equipment: Walker - 4 wheels Additional Comments: Pt reports that she walked from her living area to the dining room with her rollator by herself PTA. Prior Function Level of Independence: Needs assistance Gait / Transfers Assistance Needed: supervision for ambulation with RW per pt ADL's / Homemaking Assistance Needed: supervised for bathing and dressing per RN Communication Communication: Other (comment) (speaks  quietly) Dominant Hand:  Right    Cognition  Cognition Arousal/Alertness: Awake/alert Behavior During Therapy: WFL for tasks assessed/performed Overall Cognitive Status: History of cognitive impairments - at baseline    Extremity/Trunk Assessment Upper Extremity Assessment Upper Extremity Assessment: Defer to OT evaluation Lower Extremity Assessment Lower Extremity Assessment: Generalized weakness   Balance Balance Balance Assessed: Yes Static Sitting Balance Static Sitting - Balance Support: Feet supported;No upper extremity supported Static Sitting - Level of Assistance: 5: Stand by assistance Static Standing Balance Static Standing - Balance Support: No upper extremity supported;During functional activity Static Standing - Level of Assistance: 4: Min assist Static Standing - Comment/# of Minutes: 2 min at sink to wash face with pt leaning posteriorly if not assisted with balance and pt did reach for sink when she felt unsteady but demonstrated no other balance reactions.    End of Session PT - End of Session Equipment Utilized During Treatment: Gait belt Activity Tolerance: Patient tolerated treatment well Patient left: in chair;with call bell/phone within reach Nurse Communication: Mobility status       INGOLD,Empress Newmann 12/09/2012, 10:21 AM [facility] [phone] (pager)

## 2012-12-09 NOTE — Clinical Social Work Placement (Addendum)
Clinical Social Work Department CLINICAL SOCIAL WORK PLACEMENT NOTE 12/09/2012  Patient:  Regina Burch Account Number:  [id number] Admit date:  12/06/2012  Clinical Social Worker:  Maryclare Labrador, Theresia Majors  Date/time:  12/09/2012 04:35 PM  Clinical Social Work is seeking post-discharge placement for this patient at the following level of care:   SKILLED NURSING   (*CSW will update this form in Epic as items are completed)     Patient/family provided with [facility] Department of Clinical Social Work's list of facilities offering this level of care within the geographic area requested by the patient (or if unable, by the patient's family).    Patient/family informed of their freedom to choose among providers that offer the needed level of care, that participate in Medicare, Medicaid or managed care program needed by the patient, have an available bed and are willing to accept the patient.    Patient/family informed of [facility]' ownership interest in [facility], as well as of the fact that they are under no obligation to receive care at this facility.  PASARR submitted to EDS on  PASARR number received from EDS on   FL2 transmitted to all facilities in geographic area requested by pt/family on   FL2 transmitted to all facilities within larger geographic area on   Patient informed that his/her managed care company has contracts with or will negotiate with  certain facilities, including the following:     Patient/family informed of bed offers received:   Patient chooses bed at  Physician recommends and patient chooses bed at    Patient to be transferred to  on   Patient to be transferred to facility by   The following physician request were entered in Epic:   Additional Comments:

## 2012-12-09 NOTE — Progress Notes (Signed)
K+- 3.3 md made aware.

## 2012-12-10 DIAGNOSIS — I359 Nonrheumatic aortic valve disorder, unspecified: Secondary | ICD-10-CM

## 2012-12-10 DIAGNOSIS — I4891 Unspecified atrial fibrillation: Secondary | ICD-10-CM

## 2012-12-10 DIAGNOSIS — F0390 Unspecified dementia without behavioral disturbance: Secondary | ICD-10-CM | POA: Diagnosis present

## 2012-12-10 DIAGNOSIS — R55 Syncope and collapse: Secondary | ICD-10-CM | POA: Diagnosis present

## 2012-12-10 DIAGNOSIS — Z950 Presence of cardiac pacemaker: Secondary | ICD-10-CM | POA: Diagnosis present

## 2012-12-10 DIAGNOSIS — I350 Nonrheumatic aortic (valve) stenosis: Secondary | ICD-10-CM | POA: Diagnosis present

## 2012-12-10 LAB — BASIC METABOLIC PANEL
CO2: 23 mEq/L (ref 19–32)
Calcium: 8.9 mg/dL (ref 8.4–10.5)
Chloride: 105 mEq/L (ref 96–112)
Creatinine, Ser: 0.67 mg/dL (ref 0.50–1.10)
GFR calc Af Amer: 81 mL/min — ABNORMAL LOW (ref >90)
Glucose, Bld: 93 mg/dL (ref 70–99)
Sodium: 141 mEq/L (ref 135–145)

## 2012-12-10 LAB — GLUCOSE, CAPILLARY: Glucose-Capillary: 180 mg/dL — ABNORMAL HIGH (ref 70–99)

## 2012-12-10 MED ORDER — LISINOPRIL 5 MG PO TABS: 5.0000 mg | ORAL_TABLET | Freq: Every day | ORAL | Status: DC

## 2012-12-10 MED ORDER — VITAMIN D3 25 MCG (1000 UNIT) PO TABS: 1000.0000 [IU] | ORAL_TABLET | Freq: Every day | ORAL | Status: DC

## 2012-12-10 NOTE — Care Management Note (Signed)
    Page 1 of 2   12/10/2012     1:30:18 PM   CARE MANAGEMENT NOTE 12/10/2012  Patient:  MAZY, CULTON   Account Number:  [id number]  Date Initiated:  12/08/2012  Documentation initiated by:  [facility]  Subjective/Objective Assessment:   syncopal, vomiting     Action/Plan:   from ALF   Anticipated DC Date:  12/10/2012   Anticipated DC Plan:  ASSISTED LIVING / REST HOME  In-house referral  Clinical Social Worker      DC Associate Professor  CM consult      [facility] Choice  Resumption Of Svcs/PTA Provider   Choice offered to / List presented to:          [place] arranged  HH-1 RN  HH-2 PT  HH-3 OT      [facility] agency  [facility]   Status of service:  Completed, signed off Medicare Important Message given?   (If response is "NO", the following Medicare IM given date fields will be blank) Date Medicare IM given:   Date Additional Medicare IM given:    Discharge Disposition:  HOME W HOME HEALTH SERVICES  Per UR Regulation:  Reviewed for med. necessity/level of care/duration of stay  If discussed at Long Length of Stay Meetings, dates discussed:    Comments:  10/7  1328p debbie Rooney Swails rn,bsn spoke w mary w [facility] and pt is act w them for rn and ot. have asked for phy ther to be added. she will need resumption of hhc orders.pt to return to  alf at disch per family.  10/6 1228p debbie Karsen Nakanishi rn,bsn sw has seen pt. phy ther [facility] level of care. sw to assist.

## 2012-12-10 NOTE — Progress Notes (Signed)
Report called to [facility], patient to return there

## 2012-12-10 NOTE — Discharge Summary (Addendum)
Physician Discharge Summary  Regina Burch:[id number] DOB: [birth year] DOA: 12/07/2012  PCP: Caffie Damme, MD  Admit date: 12/07/2012 Discharge date: 12/10/2012  Time spent: >45 minutes  Recommendations for Outpatient Follow-up:  1. Follow BP BID 2. F/u K+ level   Discharge Diagnoses:  Principal Problem:   Hypertensive urgency Active Problems:   Pulmonary edema- likely acute on chronic diastolic CHF exacerbated by HTN   Hypokalemia   Syncope   Aortic stenosis   Cardiac pacemaker in situ   Dementia   Discharge Condition: stable  Diet recommendation: low sodium  Filed Weights   12/07/12 2200 12/08/12 0426 12/10/12 0404  Weight: 45.859 kg (101 lb 1.6 oz) 43.6 kg (96 lb 1.9 oz) 43.7 kg (96 lb 5.5 oz)    History of present illness:  79 y.o. female who presented to the ED after vomiting x1 and passing out at her ALF. She was extremely dizzy after the syncopal episode when she tried to walk with her walker. She was brought to the ED after the fall. Thankfully the patient was uninjured. Work up in the ED revealed a SBP > 200. CXR demonstrated moderate CHF. Nitro GTT started, lasix given, and Hospitalist asked to admit.    Hospital Course:  [place] HTN  BP fluctuating significantly - take care to avoid overcorrection - off nitro gtt - cont home meds   - goal is to avoid  Pulmonary edema  due to uncontrolled HTN, while also avoiding over aggressive correction and resultant orthostasis/sycnope   Pulmonary edema  No longer requiring supplemental O2 - avoid further diuresis at this time, apart from usual home schedule  - see echo below  Syncope  Likely vaso-vagal in setting of nausea/vomiting (likely due to UTI) in pt w/ AoS - tolerted ambulation w/ PT today   +UA -  UTI ? Culture resulted today- only grew out diptheroids> 100,00 colonies - she has completed 3 days of Rocephin   Chronic Afib  Rate controlled at this time - not felt to be anticoag candidate due to  advanced age and high fall risk   Mild hypokalemia  Mg is normal - cont to replace K to goal of >3.5   Hx of CVA   AoS  -mean gradient reveals this is mild Not a candidate for intervention - manage volume carefully   S/P pacemaker  Hx scant - pacer noted on CXR   Dementia  stable    ECHO 5/14 The patient was in atrial fibrillation. Technically difficult study with poor acoustic windows. Normal LV size with moderate LV hypertrophy. EF 55%. Moderate biatrial enlargement. At least mild RV dilation with normal systolic function. Left-shifted interventricular septum in diastole suggested RV volume overload. Severely calcified aortic valve that visually appeared at least moderately and perhaps severely stenotic. By mean gradient however, stenosis was mild. Would consider TEE or further TTE images to more closely evaluate the aortic valve. There was mild pulmonary hypertension. Biatrial enlargement.     Consultations:  none  Discharge Exam: Filed Vitals:   12/10/12 1155  BP: 164/51  Pulse: 70  Temp: 98.5 F (36.9 C)  Resp: 14    General: elderly female sitting comfortably in a chair Cardiovascular: RRR, 2/6 murmur at right upper sternal border Respiratory: CTA b/l - 95% on RA   Discharge Instructions      Discharge Orders   Future Orders Complete By Expires   Diet - low sodium heart healthy  As directed    Increase activity slowly  As directed        Medication List         aspirin 81 MG chewable tablet  Chew 81 mg by mouth daily.     cholecalciferol 1000 UNITS tablet  Commonly known as:  VITAMIN D  Take 1 tablet (1,000 Units total) by mouth daily.     citalopram 10 MG tablet  Commonly known as:  CELEXA  Take 10 mg by mouth daily.     clonazePAM 0.5 MG tablet  Commonly known as:  KLONOPIN  Take 0.25 mg by mouth at bedtime.     digoxin 0.125 MG tablet  Commonly known as:  LANOXIN  Take 0.125 mg by mouth every Monday, Wednesday, and Friday.      diltiazem 120 MG 24 hr capsule  Commonly known as:  DILACOR XR  Take 120 mg by mouth every other day. Alternated with diltiazem 24 hr 240 mg capsule     diltiazem 240 MG 24 hr capsule  Commonly known as:  CARDIZEM CD  Take 240 mg by mouth every other day. Alternates with diltiazem 120mg  24h capsule     donepezil 5 MG tablet  Commonly known as:  ARICEPT  Take 5 mg by mouth at bedtime.     furosemide 20 MG tablet  Commonly known as:  LASIX  Take 20 mg by mouth daily.     lisinopril 5 MG tablet  Commonly known as:  PRINIVIL,ZESTRIL  Take 1 tablet (5 mg total) by mouth daily.     loratadine 10 MG tablet  Commonly known as:  CLARITIN  Take 10 mg by mouth daily as needed for allergies.     metoprolol tartrate 12.5 mg Tabs tablet  Commonly known as:  LOPRESSOR  Take 0.5 tablets (12.5 mg total) by mouth 2 (two) times daily.     multivitamins with iron Tabs tablet  Take 1 tablet by mouth daily.     sertraline 25 MG tablet  Commonly known as:  ZOLOFT  Take 25 mg by mouth daily.       No Known Allergies    The results of significant diagnostics from this hospitalization (including imaging, microbiology, ancillary and laboratory) are listed below for reference.    Significant Diagnostic Studies: Dg Chest 2 View  12/07/2012   CLINICAL DATA:  Hypoxia.  EXAM: CHEST - 2 VIEW  COMPARISON:  08/05/2012  FINDINGS: There is evidence of moderate CHF. No significant pleural fluid is identified. There is a stable appearance to a dual chambered pacemaker. The heart size is normal. The visualized skeletal structures are unremarkable.  IMPRESSION: Moderate congestive heart failure.   Electronically Signed   By: Irish Lack M.D.   On: 12/07/2012 19:34    Microbiology: Recent Results (from the past 240 hour(s))  URINE CULTURE     Status: None   Collection Time    12/07/12  7:45 PM      Result Value Range Status   Specimen Description URINE, CLEAN CATCH   Final   Special Requests  ADDED 2024   Final   Culture  Setup Time     Final   Value: 12/08/2012 03:03     Performed at [facility]   Colony Count     Final   Value: >=100,000 COLONIES/ML     Performed at [facility]   Culture     Final   Value: DIPHTHEROIDS(CORYNEBACTERIUM SPECIES)     Note: Standardized susceptibility testing for this organism is not available.  Performed at [facility]   Report Status 12/09/2012 FINAL   Final  MRSA PCR SCREENING     Status: None   Collection Time    12/07/12 10:00 PM      Result Value Range Status   MRSA by PCR NEGATIVE  NEGATIVE Final   Comment:            The GeneXpert MRSA Assay (FDA     approved for NASAL specimens     only), is one component of a     comprehensive MRSA colonization     surveillance program. It is not     intended to diagnose MRSA     infection nor to guide or     monitor treatment for     MRSA infections.     Labs: Basic Metabolic Panel:  Recent Labs Lab 12/07/12 1830 12/08/12 0540 12/09/12 0450 12/10/12 0415  NA 144 143 139 141  K 3.8 3.3* 3.3* 3.7  CL 106 103 100 105  CO2 25 27 29 23   GLUCOSE 123* 93 86 93  BUN 19 17 23 22   CREATININE 0.67 0.74 0.84 0.67  CALCIUM 9.2 8.9 9.0 8.9  MG  --   --  1.9  --    Liver Function Tests: No results found for this basename: AST, ALT, ALKPHOS, BILITOT, PROT, ALBUMIN,  in the last 168 hours No results found for this basename: LIPASE, AMYLASE,  in the last 168 hours No results found for this basename: AMMONIA,  in the last 168 hours CBC:  Recent Labs Lab 12/07/12 1830  WBC 8.8  NEUTROABS 6.4  HGB 13.6  HCT 38.8  MCV 87.8  PLT 272   Cardiac Enzymes:  Recent Labs Lab 12/07/12 1830 12/08/12 0158 12/08/12 0540  TROPONINI <0.30 <0.30 <0.30   BNP: BNP (last 3 results)  Recent Labs  08/08/12 0815 12/07/12 1830  PROBNP 3902.0* 3128.0*   CBG:  Recent Labs Lab 12/09/12 0801 12/09/12 1250 12/09/12 1631 12/09/12 2141 12/10/12 0926  GLUCAP  105* 116* 113* 109* 180*       Signed:  Jud Burch  [facility] 12/10/2012, 12:44 PM

## 2012-12-10 NOTE — Progress Notes (Signed)
Discharged via wheelchair to private vehicle, transported by Amy, granddaughter-in-law to [facility], Berle Mull RN

## 2012-12-10 NOTE — Progress Notes (Signed)
Awaiting family to pick up and provide transportation back to [facility], packet provided with prescrition for new med and written discharge instructions for the faciilty,Kisha Messman [employer]

## 2013-05-25 ENCOUNTER — Emergency Department (HOSPITAL_COMMUNITY): Payer: Medicare Other

## 2013-05-25 ENCOUNTER — Emergency Department (HOSPITAL_COMMUNITY)
Admission: EM | Admit: 2013-05-25 | Discharge: 2013-05-25 | Disposition: A | Discharge status: Home / Self Care | Payer: Medicare Other | Attending: Emergency Medicine | Admitting: Emergency Medicine

## 2013-05-25 ENCOUNTER — Encounter (HOSPITAL_COMMUNITY): Payer: Self-pay | Admitting: Emergency Medicine

## 2013-05-25 DIAGNOSIS — R5381 Other malaise: Secondary | ICD-10-CM | POA: Insufficient documentation

## 2013-05-25 DIAGNOSIS — Z950 Presence of cardiac pacemaker: Secondary | ICD-10-CM | POA: Insufficient documentation

## 2013-05-25 DIAGNOSIS — R5383 Other fatigue: Secondary | ICD-10-CM

## 2013-05-25 DIAGNOSIS — Z79899 Other long term (current) drug therapy: Secondary | ICD-10-CM | POA: Insufficient documentation

## 2013-05-25 DIAGNOSIS — S1093XA Contusion of unspecified part of neck, initial encounter: Principal | ICD-10-CM

## 2013-05-25 DIAGNOSIS — Y9210 Unspecified residential institution as the place of occurrence of the external cause: Secondary | ICD-10-CM | POA: Insufficient documentation

## 2013-05-25 DIAGNOSIS — S0003XA Contusion of scalp, initial encounter: Secondary | ICD-10-CM | POA: Insufficient documentation

## 2013-05-25 DIAGNOSIS — W19XXXA Unspecified fall, initial encounter: Secondary | ICD-10-CM | POA: Insufficient documentation

## 2013-05-25 DIAGNOSIS — S0083XA Contusion of other part of head, initial encounter: Principal | ICD-10-CM

## 2013-05-25 DIAGNOSIS — Z7982 Long term (current) use of aspirin: Secondary | ICD-10-CM | POA: Insufficient documentation

## 2013-05-25 DIAGNOSIS — Y9389 Activity, other specified: Secondary | ICD-10-CM | POA: Insufficient documentation

## 2013-05-25 LAB — COMPREHENSIVE METABOLIC PANEL
ALT: 22 U/L (ref 0–35)
AST: 30 U/L (ref 0–37)
Albumin: 3.7 g/dL (ref 3.5–5.2)
Alkaline Phosphatase: 124 U/L — ABNORMAL HIGH (ref 39–117)
BUN: 10 mg/dL (ref 6–23)
CO2: 28 meq/L (ref 19–32)
Calcium: 9.7 mg/dL (ref 8.4–10.5)
Chloride: 98 mEq/L (ref 96–112)
Creatinine, Ser: 0.64 mg/dL (ref 0.50–1.10)
GFR calc Af Amer: 83 mL/min — ABNORMAL LOW (ref >90)
GFR calc non Af Amer: 71 mL/min — ABNORMAL LOW (ref >90)
Glucose, Bld: 102 mg/dL — ABNORMAL HIGH (ref 70–99)
POTASSIUM: 4.3 meq/L (ref 3.7–5.3)
SODIUM: 139 meq/L (ref 137–147)
TOTAL PROTEIN: 8.5 g/dL — AB (ref 6.0–8.3)
Total Bilirubin: 0.4 mg/dL (ref 0.3–1.2)

## 2013-05-25 LAB — CBC WITH DIFFERENTIAL/PLATELET
BASOS ABS: 0 10*3/uL (ref 0.0–0.1)
Basophils Relative: 0 % (ref 0–1)
EOS ABS: 0.1 10*3/uL (ref 0.0–0.7)
EOS PCT: 1 % (ref 0–5)
HCT: 40.3 % (ref 36.0–46.0)
Hemoglobin: 13.4 g/dL (ref 12.0–15.0)
LYMPHS PCT: 20 % (ref 12–46)
Lymphs Abs: 2.1 10*3/uL (ref 0.7–4.0)
MCH: 30.7 pg (ref 26.0–34.0)
MCHC: 33.3 g/dL (ref 30.0–36.0)
MCV: 92.2 fL (ref 78.0–100.0)
Monocytes Absolute: 0.9 10*3/uL (ref 0.1–1.0)
Monocytes Relative: 9 % (ref 3–12)
NEUTROS PCT: 70 % (ref 43–77)
Neutro Abs: 7.3 10*3/uL (ref 1.7–7.7)
PLATELETS: 421 10*3/uL — AB (ref 150–400)
RBC: 4.37 MIL/uL (ref 3.87–5.11)
RDW: 14.5 % (ref 11.5–15.5)
WBC: 10.4 10*3/uL (ref 4.0–10.5)

## 2013-05-25 LAB — URINALYSIS, ROUTINE W REFLEX MICROSCOPIC
Bilirubin Urine: NEGATIVE
Glucose, UA: NEGATIVE mg/dL
Ketones, ur: NEGATIVE mg/dL
Leukocytes, UA: NEGATIVE
Nitrite: NEGATIVE
Protein, ur: NEGATIVE mg/dL
SPECIFIC GRAVITY, URINE: 1.006 (ref 1.005–1.030)
Urobilinogen, UA: 0.2 mg/dL (ref 0.0–1.0)
pH: 7.5 (ref 5.0–8.0)

## 2013-05-25 LAB — URINE MICROSCOPIC-ADD ON: Urine-Other: NONE SEEN

## 2013-05-25 LAB — TROPONIN I: Troponin I: <0.3 ng/mL (ref <0.30)

## 2013-05-25 LAB — DIGOXIN LEVEL

## 2013-05-25 NOTE — ED Notes (Signed)
Pt is not on blood thinners

## 2013-05-25 NOTE — Discharge Instructions (Signed)
Fall Prevention and Home Safety Falls cause injuries and can affect all age groups. It is possible to use preventive measures to significantly decrease the likelihood of falls. There are many simple measures which can make your home safer and prevent falls. OUTDOORS  Repair cracks and edges of walkways and driveways.  Remove high doorway thresholds.  Trim shrubbery on the main path into your home.  Have good outside lighting.  Clear walkways of tools, rocks, debris, and clutter.  Check that handrails are not broken and are securely fastened. Both sides of steps should have handrails.  Have leaves, snow, and ice cleared regularly.  Use sand or salt on walkways during winter months.  In the garage, clean up grease or oil spills. BATHROOM  Install night lights.  Install grab bars by the toilet and in the tub and shower.  Use non-skid mats or decals in the tub or shower.  Place a plastic non-slip stool in the shower to sit on, if needed.  Keep floors dry and clean up all water on the floor immediately.  Remove soap buildup in the tub or shower on a regular basis.  Secure bath mats with non-slip, double-sided rug tape.  Remove throw rugs and tripping hazards from the floors. BEDROOMS  Install night lights.  Make sure a bedside light is easy to reach.  Do not use oversized bedding.  Keep a telephone by your bedside.  Have a firm chair with side arms to use for getting dressed.  Remove throw rugs and tripping hazards from the floor. KITCHEN  Keep handles on pots and pans turned toward the center of the stove. Use back burners when possible.  Clean up spills quickly and allow time for drying.  Avoid walking on wet floors.  Avoid hot utensils and knives.  Position shelves so they are not too high or low.  Place commonly used objects within easy reach.  If necessary, use a sturdy step stool with a grab bar when reaching.  Keep electrical cables out of the  way.  Do not use floor polish or wax that makes floors slippery. If you must use wax, use non-skid floor wax.  Remove throw rugs and tripping hazards from the floor. STAIRWAYS  Never leave objects on stairs.  Place handrails on both sides of stairways and use them. Fix any loose handrails. Make sure handrails on both sides of the stairways are as long as the stairs.  Check carpeting to make sure it is firmly attached along stairs. Make repairs to worn or loose carpet promptly.  Avoid placing throw rugs at the top or bottom of stairways, or properly secure the rug with carpet tape to prevent slippage. Get rid of throw rugs, if possible.  Have an electrician put in a light switch at the top and bottom of the stairs. OTHER FALL PREVENTION TIPS  Wear low-heel or rubber-soled shoes that are supportive and fit well. Wear closed toe shoes.  When using a stepladder, make sure it is fully opened and both spreaders are firmly locked. Do not climb a closed stepladder.  Add color or contrast paint or tape to grab bars and handrails in your home. Place contrasting color strips on first and last steps.  Learn and use mobility aids as needed. Install an electrical emergency response system.  Turn on lights to avoid dark areas. Replace light bulbs that burn out immediately. Get light switches that glow.  Arrange furniture to create clear pathways. Keep furniture in the same place.  Firmly attach carpet with non-skid or double-sided tape. °· Eliminate uneven floor surfaces. °· Select a carpet pattern that does not visually hide the edge of steps. °· Be aware of all pets. °OTHER HOME SAFETY TIPS °· Set the water temperature for 120° F (48.8° C). °· Keep emergency numbers on or near the telephone. °· Keep smoke detectors on every level of the home and near sleeping areas. °Document Released: 02/10/2002 Document Revised: 08/22/2011 Document Reviewed: 05/12/2011 °ExitCare® Patient Information ©2014  [employer]. ° °Facial or Scalp Contusion °A facial or scalp contusion is a deep bruise on the face or head. Injuries to the face and head generally cause a lot of swelling, especially around the eyes. Contusions are the result of an injury that caused bleeding under the skin. The contusion may turn blue, purple, or yellow. Minor injuries will give you a painless contusion, but more severe contusions may stay painful and swollen for a few weeks.  °CAUSES  °A facial or scalp contusion is caused by a blunt injury or trauma to the face or head area.  °SIGNS AND SYMPTOMS  °· Swelling of the injured area.   °· Discoloration of the injured area.   °· Tenderness, soreness, or pain in the injured area.   °DIAGNOSIS  °The diagnosis can be made by taking a medical history and doing a physical exam. An X-ray exam, CT scan, or MRI may be needed to determine if there are any associated injuries, such as broken bones (fractures). °TREATMENT  °Often, the best treatment for a facial or scalp contusion is applying cold compresses to the injured area. Over-the-counter medicines may also be recommended for pain control.  °HOME CARE INSTRUCTIONS  °· Only take over-the-counter or prescription medicines as directed by your health care provider.   °· Apply ice to the injured area.   °· Put ice in a plastic bag.   °· Place a towel between your skin and the bag.   °· Leave the ice on for 20 minutes, 2 3 times a day.   °SEEK MEDICAL CARE IF: °· You have bite problems.   °· You have pain with chewing.   °· You are concerned about facial defects. °SEEK IMMEDIATE MEDICAL CARE IF: °· You have severe pain or a headache that is not relieved by medicine.   °· You have unusual sleepiness, confusion, or personality changes.   °· You throw up (vomit).   °· You have a persistent nosebleed.   °· You have double vision or blurred vision.   °· You have fluid drainage from your nose or ear.   °· You have difficulty walking or using your arms or legs.    °MAKE SURE YOU:  °· Understand these instructions. °· Will watch your condition. °· Will get help right away if you are not doing well or get worse. °Document Released: 03/30/2004 Document Revised: 12/11/2012 Document Reviewed: 10/03/2012 °ExitCare® Patient Information ©2014 [employer]. ° °

## 2013-05-25 NOTE — ED Notes (Signed)
[facility] called to transport pt back to [facility] 

## 2013-05-25 NOTE — ED Provider Notes (Signed)
CSN: [id number]     Arrival date & time 05/25/13  0721 History   First MD Initiated Contact with Patient 05/25/13 0750     Chief Complaint  Patient presents with  . Fall  . Head Injury     (Consider location/radiation/quality/duration/timing/severity/associated sxs/prior Treatment) HPI Comments: 80 year old female presenting after a fall. Unwitnessed. Has a hematoma to her right side of her head. She denies other injuries. She does not recall the events surrounding her fall. However, she does report feeling more weak than usual.  Patient is a 80 y.o. female presenting with fall.  Fall This is a new problem. Episode onset: unknown. Episode frequency: once. The problem has been resolved. Associated symptoms include headaches. Pertinent negatives include no chest pain, no abdominal pain and no shortness of breath. Associated symptoms comments: Generalized weakness.  Frequent falls. Nothing aggravates the symptoms. Nothing relieves the symptoms. She has tried nothing for the symptoms.    Past Medical History  Diagnosis Date  . Stroke   . A-fib   . Hypertension   . Aortic regurgitation   . Carotid stenosis   . Degenerative joint disease   . Hyperlipemia   . Senile dementia    Past Surgical History  Procedure Laterality Date  . Pacemaker insertion     Family History  Problem Relation Age of Onset  . Stroke Mother    History  Substance Use Topics  . Smoking status: Never Smoker   . Smokeless tobacco: Not on file  . Alcohol Use: No   OB History   Grav Para Term Preterm Abortions TAB SAB Ect Mult Living                 Review of Systems  Respiratory: Negative for shortness of breath.   Cardiovascular: Negative for chest pain.  Gastrointestinal: Negative for abdominal pain.  Neurological: Positive for headaches.  All other systems reviewed and are negative.      Allergies  Review of patient's allergies indicates no known allergies.  Home Medications   Current  Outpatient Rx  Name  Route  Sig  Dispense  Refill  . acetaminophen (TYLENOL) 325 MG tablet   Oral   Take 650 mg by mouth every 6 (six) hours as needed for mild pain or moderate pain.         Marland Kitchen. aspirin 81 MG chewable tablet   Oral   Chew 81 mg by mouth daily.         . clonazePAM (KLONOPIN) 0.5 MG tablet   Oral   Take 0.5 mg by mouth at bedtime.          . Cranberry-Vitamin C-Probiotic (AZO CRANBERRY PO)   Oral   Take 1 tablet by mouth 2 (two) times daily.         . digoxin (LANOXIN) 0.125 MG tablet   Oral   Take 0.125 mg by mouth every Monday, Wednesday, and Friday.         . diltiazem (CARDIZEM CD) 240 MG 24 hr capsule   Oral   Take 240 mg by mouth every other day. Alternates with diltiazem 120mg  24h capsule         . diltiazem (DILACOR XR) 120 MG 24 hr capsule   Oral   Take 120 mg by mouth every other day. Alternated with diltiazem 24 hr 240 mg capsule         . donepezil (ARICEPT) 5 MG tablet   Oral   Take 5 mg by mouth at  bedtime.         . feeding supplement (ENSURE IMMUNE HEALTH) LIQD   Oral   Take 237 mLs by mouth daily.         Marland Kitchen lisinopril (PRINIVIL,ZESTRIL) 10 MG tablet   Oral   Take 10 mg by mouth daily.         . metoprolol tartrate (LOPRESSOR) 12.5 mg TABS   Oral   Take 0.5 tablets (12.5 mg total) by mouth 2 (two) times daily.         . NON FORMULARY      Rinse perinal area once a week with vinegar wash         . sertraline (ZOLOFT) 50 MG tablet   Oral   Take 50 mg by mouth daily.          BP 186/85  Pulse 81  Temp(Src) 97.6 F (36.4 C) (Oral)  Resp 16  SpO2 98% Physical Exam  Nursing note and vitals reviewed. Constitutional: She is oriented to person, place, and time. She appears well-developed and well-nourished. No distress.  HENT:  Head: Normocephalic. Head is with contusion. Head is without raccoon's eyes and without Battle's sign.    Nose: Nose normal.  Eyes: Conjunctivae and EOM are normal. Pupils are  equal, round, and reactive to light. No scleral icterus.  Neck: No spinous process tenderness and no muscular tenderness present.  Cardiovascular: Normal rate, regular rhythm, normal heart sounds and intact distal pulses.   No murmur heard. Pulmonary/Chest: Effort normal and breath sounds normal. She has no rales. She exhibits no tenderness.  Abdominal: Soft. There is no tenderness. There is no rebound and no guarding.  Musculoskeletal: Normal range of motion. She exhibits no edema and no tenderness.       Thoracic back: She exhibits bony tenderness. She exhibits no tenderness.       Lumbar back: She exhibits no tenderness and no bony tenderness.  No evidence of trauma to extremities, except as noted.  2+ distal pulses.    Neurological: She is alert and oriented to person, place, and time.  Skin: Skin is warm and dry. No rash noted.  Psychiatric: She has a normal mood and affect.    ED Course  Procedures (including critical care time) Labs Review Labs Reviewed  CBC WITH DIFFERENTIAL - Abnormal; Notable for the following:    Platelets 421 (*)    All other components within normal limits  COMPREHENSIVE METABOLIC PANEL - Abnormal; Notable for the following:    Glucose, Bld 102 (*)    Total Protein 8.5 (*)    Alkaline Phosphatase 124 (*)    GFR calc non Af Amer 71 (*)    GFR calc Af Amer 83 (*)    All other components within normal limits  URINALYSIS, ROUTINE W REFLEX MICROSCOPIC - Abnormal; Notable for the following:    APPearance CLOUDY (*)    Hgb urine dipstick TRACE (*)    All other components within normal limits  DIGOXIN LEVEL - Abnormal; Notable for the following:    Digoxin Level <0.3 (*)    All other components within normal limits  TROPONIN I  URINE MICROSCOPIC-ADD ON   Imaging Review Dg Chest 1 View  05/25/2013   CLINICAL DATA:  Back pain after fall.  EXAM: CHEST - 1 VIEW  COMPARISON:  December 07, 2012.  FINDINGS: Cardiomediastinal silhouette appears normal. Left-sided  pacemaker is again noted and unchanged in position. No pneumothorax or pleural effusion is noted. No acute  pulmonary disease is noted. Bony thorax is intact.  IMPRESSION: No acute cardiopulmonary abnormality seen.   Electronically Signed   By: Roque Lias M.D.   On: 05/25/2013 09:31   Dg Thoracic Spine 2 View  05/25/2013   CLINICAL DATA:  Back pain after fall.  EXAM: THORACIC SPINE - 2 VIEW  COMPARISON:  None.  FINDINGS: No fracture or spondylolisthesis is noted. Mild degenerative changes are noted. Atherosclerotic calcifications of descending thoracic aorta are noted.  IMPRESSION: No acute abnormality seen in the thoracic spine.   Electronically Signed   By: Roque Lias M.D.   On: 05/25/2013 09:27   Ct Head Wo Contrast  05/25/2013   CLINICAL DATA:  Fall  EXAM: CT HEAD WITHOUT CONTRAST  CT CERVICAL SPINE WITHOUT CONTRAST  TECHNIQUE: Multidetector CT imaging of the head and cervical spine was performed following the standard protocol without intravenous contrast. Multiplanar CT image reconstructions of the cervical spine were also generated.  COMPARISON:  None.  FINDINGS: CT HEAD FINDINGS  No evidence of parenchymal hemorrhage or extra-axial fluid collection. No mass lesion, mass effect, or midline shift.  No CT evidence of acute infarction.  Subcortical white matter and periventricular small vessel ischemic changes. Intracranial atherosclerosis.  Age related atrophy.  No ventriculomegaly.  The visualized paranasal sinuses are essentially clear. The mastoid air cells are unopacified.  Extracranial hematoma overlying the right parietal bone (series 3/image 21).  No evidence of calvarial fracture.  CT CERVICAL SPINE FINDINGS  Reversal of the normal cervical lordosis.  No evidence of fracture dislocation. Vertebral body heights are maintained. Dens appears intact.  Moderate multilevel degenerative changes, most prominent at C5-6.  No prevertebral soft tissue swelling. Visualized thyroid is grossly unremarkable.   Visualized lung apices are notable for biapical pleural parenchymal scarring.  IMPRESSION: Extracranial hematoma overlying the right parietal bone. No evidence of calvarial fracture.  No evidence of traumatic injury to the cervical spine. Moderate multilevel degenerative changes.   Electronically Signed   By: Charline Bills M.D.   On: 05/25/2013 09:00   Ct Cervical Spine Wo Contrast  05/25/2013   CLINICAL DATA:  Fall  EXAM: CT HEAD WITHOUT CONTRAST  CT CERVICAL SPINE WITHOUT CONTRAST  TECHNIQUE: Multidetector CT imaging of the head and cervical spine was performed following the standard protocol without intravenous contrast. Multiplanar CT image reconstructions of the cervical spine were also generated.  COMPARISON:  None.  FINDINGS: CT HEAD FINDINGS  No evidence of parenchymal hemorrhage or extra-axial fluid collection. No mass lesion, mass effect, or midline shift.  No CT evidence of acute infarction.  Subcortical white matter and periventricular small vessel ischemic changes. Intracranial atherosclerosis.  Age related atrophy.  No ventriculomegaly.  The visualized paranasal sinuses are essentially clear. The mastoid air cells are unopacified.  Extracranial hematoma overlying the right parietal bone (series 3/image 21).  No evidence of calvarial fracture.  CT CERVICAL SPINE FINDINGS  Reversal of the normal cervical lordosis.  No evidence of fracture dislocation. Vertebral body heights are maintained. Dens appears intact.  Moderate multilevel degenerative changes, most prominent at C5-6.  No prevertebral soft tissue swelling. Visualized thyroid is grossly unremarkable.  Visualized lung apices are notable for biapical pleural parenchymal scarring.  IMPRESSION: Extracranial hematoma overlying the right parietal bone. No evidence of calvarial fracture.  No evidence of traumatic injury to the cervical spine. Moderate multilevel degenerative changes.   Electronically Signed   By: Charline Bills M.D.   On:  05/25/2013 09:00  All radiology studies independently viewed  by me.      EKG Interpretation   Date/Time:  Sunday May 25 2013 09:16:00 EDT Ventricular Rate:  83 PR Interval:    QRS Duration: 83 QT Interval:  353 QTC Calculation: 415 R Axis:   -46 Text Interpretation:  Atrial fibrillation Left anterior fascicular block  LVH with secondary repolarization abnormality Anterior Q waves, possibly  due to LVH No significant change was found compared to Oct-04,2014  Confirmed by [facility]  MD, TREY ([id number]) on 05/25/2013 11:47:09 AM      MDM   Final diagnoses:  Fall  Scalp hematoma    80 yo female presenting after a fall.  Has a head contusion.  Not on blood thinners.  She complains of generalized weakness, but is not able to pinpoint any other significant symptoms.  CT head, plain film T spine, labs, UA, EKG.    Workup is unremarkable.  Pt remained well appearing. Wheelchair bound normally, but grandson reports that she frequently tries to walk.  She appears at her baseline and is stable for discharge back to her facility.    Candyce Churn III, MD 05/25/13 1213

## 2013-05-25 NOTE — ED Notes (Signed)
Pt from facility, reports that she was "just walking and fell down." Pt unable to report LOC. Pt has knot to the back of head with abrasion, bleeding controlled. Pt is alert and in NAD.

## 2013-05-25 NOTE — ED Notes (Signed)
Per EMS[facility]  Pt found in bathroom washing hair.  Pt had hematoma to left head. Small laceration.  Fall unwitnessed.  Pt was sitting in wheelchair.  Pt is alert and oriented per baseline.  Pt normally gets around well.  Pt is a little weaker than normal per staff.  Pacemaker.  DNR.  Vitals: 160 bp palp, hr 90, cg 111, resp 14, 97% ra

## 2013-05-25 NOTE — ED Notes (Signed)
Bed: [id number] Expected date: 05/25/13 Expected time: 7:15 AM Means of arrival:  Comments: fall

## 2013-05-25 NOTE — ED Notes (Signed)
Ambulated well but needed a little bit of assistance.

## 2013-09-24 ENCOUNTER — Emergency Department (HOSPITAL_COMMUNITY): Payer: Medicare Other

## 2013-09-24 ENCOUNTER — Encounter (HOSPITAL_COMMUNITY): Payer: Self-pay | Admitting: Emergency Medicine

## 2013-09-24 ENCOUNTER — Emergency Department (HOSPITAL_COMMUNITY)
Admission: EM | Admit: 2013-09-24 | Discharge: 2013-09-24 | Disposition: A | Discharge status: Home / Self Care | Payer: Medicare Other | Attending: Emergency Medicine | Admitting: Emergency Medicine

## 2013-09-24 DIAGNOSIS — Z8673 Personal history of transient ischemic attack (TIA), and cerebral infarction without residual deficits: Secondary | ICD-10-CM | POA: Diagnosis not present

## 2013-09-24 DIAGNOSIS — Z7982 Long term (current) use of aspirin: Secondary | ICD-10-CM | POA: Diagnosis not present

## 2013-09-24 DIAGNOSIS — E785 Hyperlipidemia, unspecified: Secondary | ICD-10-CM | POA: Insufficient documentation

## 2013-09-24 DIAGNOSIS — Z79899 Other long term (current) drug therapy: Secondary | ICD-10-CM | POA: Insufficient documentation

## 2013-09-24 DIAGNOSIS — Y9389 Activity, other specified: Secondary | ICD-10-CM | POA: Insufficient documentation

## 2013-09-24 DIAGNOSIS — I10 Essential (primary) hypertension: Secondary | ICD-10-CM | POA: Diagnosis not present

## 2013-09-24 DIAGNOSIS — I4891 Unspecified atrial fibrillation: Secondary | ICD-10-CM | POA: Insufficient documentation

## 2013-09-24 DIAGNOSIS — Y9210 Unspecified residential institution as the place of occurrence of the external cause: Secondary | ICD-10-CM | POA: Diagnosis not present

## 2013-09-24 DIAGNOSIS — M1990 Unspecified osteoarthritis, unspecified site: Secondary | ICD-10-CM | POA: Insufficient documentation

## 2013-09-24 DIAGNOSIS — S0100XA Unspecified open wound of scalp, initial encounter: Secondary | ICD-10-CM | POA: Diagnosis present

## 2013-09-24 DIAGNOSIS — X58XXXA Exposure to other specified factors, initial encounter: Secondary | ICD-10-CM | POA: Insufficient documentation

## 2013-09-24 DIAGNOSIS — F0390 Unspecified dementia without behavioral disturbance: Secondary | ICD-10-CM | POA: Insufficient documentation

## 2013-09-24 DIAGNOSIS — S0101XA Laceration without foreign body of scalp, initial encounter: Secondary | ICD-10-CM

## 2013-09-24 LAB — BASIC METABOLIC PANEL
ANION GAP: 12 (ref 5–15)
BUN: 15 mg/dL (ref 6–23)
CALCIUM: 9.3 mg/dL (ref 8.4–10.5)
CHLORIDE: 104 meq/L (ref 96–112)
CO2: 25 meq/L (ref 19–32)
CREATININE: 0.62 mg/dL (ref 0.50–1.10)
GFR calc Af Amer: 83 mL/min — ABNORMAL LOW (ref >90)
GFR calc non Af Amer: 72 mL/min — ABNORMAL LOW (ref >90)
Glucose, Bld: 107 mg/dL — ABNORMAL HIGH (ref 70–99)
Potassium: 4 mEq/L (ref 3.7–5.3)
SODIUM: 141 meq/L (ref 137–147)

## 2013-09-24 LAB — CBC WITH DIFFERENTIAL/PLATELET
BASOS PCT: 0 % (ref 0–1)
Basophils Absolute: 0 10*3/uL (ref 0.0–0.1)
Eosinophils Absolute: 0.2 10*3/uL (ref 0.0–0.7)
Eosinophils Relative: 2 % (ref 0–5)
HCT: 41.3 % (ref 36.0–46.0)
Hemoglobin: 13.5 g/dL (ref 12.0–15.0)
LYMPHS PCT: 28 % (ref 12–46)
Lymphs Abs: 2.6 10*3/uL (ref 0.7–4.0)
MCH: 29.7 pg (ref 26.0–34.0)
MCHC: 32.7 g/dL (ref 30.0–36.0)
MCV: 91 fL (ref 78.0–100.0)
Monocytes Absolute: 0.8 10*3/uL (ref 0.1–1.0)
Monocytes Relative: 9 % (ref 3–12)
NEUTROS ABS: 5.7 10*3/uL (ref 1.7–7.7)
Neutrophils Relative %: 61 % (ref 43–77)
PLATELETS: 346 10*3/uL (ref 150–400)
RBC: 4.54 MIL/uL (ref 3.87–5.11)
RDW: 15.1 % (ref 11.5–15.5)
WBC: 9.4 10*3/uL (ref 4.0–10.5)

## 2013-09-24 NOTE — ED Notes (Signed)
md at bedside  Pt alert and oriented x3, hx of dementia. Respirations even and unlabored, bilateral symmetrical rise and fall of chest. Skin warm and dry. In no acute distress. Denies needs.

## 2013-09-24 NOTE — ED Notes (Signed)
Bed: WA22 Expected date:  Expected time:  Means of arrival:  Comments: EMS-fall 

## 2013-09-24 NOTE — ED Notes (Signed)
Hx of dementia. Per ems pt is [facility], staff does not think pt fell, staff has seen pt hit head on metal wire in closet in past. Staff believes pt bent down and came up and hit head on metal wire in closet. Pt has 1 inch laceration back right side of head. Bleeding controlled. Pt alert and oriented x3. Pt denies pain.

## 2013-09-24 NOTE — ED Notes (Signed)
bp elevated on d/c.  Pt hx HTN with meds.

## 2013-09-24 NOTE — Discharge Instructions (Signed)
Staple Care and Removal °Your caregiver has used staples today to repair your wound. Staples are used to help a wound heal faster by holding the edges of the wound together. The staples can be removed when the wound has healed well enough to stay together after the staples are removed. A dressing (wound covering), depending on the location of the wound, may have been applied. This may be changed once per day or as instructed. If the dressing sticks, it may be soaked off with soapy water or hydrogen peroxide. °Only take over-the-counter or prescription medicines for pain, discomfort, or fever as directed by your caregiver.  °If you did not receive a tetanus shot today because you did not recall when your last one was given, check with your caregiver when you have your staples removed to determine if one is needed. °Return to your caregiver's office in 1 week or as suggested to have your staples removed. °SEEK IMMEDIATE MEDICAL CARE IF:  °· You have redness, swelling, or increasing pain in the wound. °· You have pus coming from the wound. °· You have a fever. °· You notice a bad smell coming from the wound or dressing. °· Your wound edges break open after staples have been removed. °Document Released: 11/15/2000 Document Revised: 05/15/2011 Document Reviewed: 11/30/2004 °ExitCare® Patient Information ©2015 [employer]. This information is not intended to replace advice given to you by your health care provider. Make sure you discuss any questions you have with your health care provider. ° °

## 2013-09-24 NOTE — ED Provider Notes (Signed)
CSN: [id number]     Arrival date & time 09/24/13  4098 History   First MD Initiated Contact with Patient 09/24/13 1008     Chief Complaint  Patient presents with  . Head Laceration   Patient is a 80 y.o. female presenting with scalp laceration.  Head Laceration  Pt has history of dementia.  Staff at her living facility found her with a small laceration at the top of her head.   Unknown if patient fell or if she hit her head on something.  In the past she has hit her head on a wire shelf in her closet.  Pt denies any headache.  No nausea or vomiting.  No chest pain, neck pain, fevers or cough.  She is not sure what actually happened. Past Medical History  Diagnosis Date  . Stroke   . A-fib   . Hypertension   . Aortic regurgitation   . Carotid stenosis   . Degenerative joint disease   . Hyperlipemia   . Senile dementia    Past Surgical History  Procedure Laterality Date  . Pacemaker insertion     Family History  Problem Relation Age of Onset  . Stroke Mother    History  Substance Use Topics  . Smoking status: Never Smoker   . Smokeless tobacco: Not on file  . Alcohol Use: No   OB History   Grav Para Term Preterm Abortions TAB SAB Ect Mult Living                 Review of Systems  All other systems reviewed and are negative.     Allergies  Review of patient's allergies indicates no known allergies.  Home Medications   Prior to Admission medications   Medication Sig Start Date End Date Taking? Authorizing Provider  acetaminophen (TYLENOL) 325 MG tablet Take 650 mg by mouth every 6 (six) hours as needed for mild pain or moderate pain.   Yes Historical Provider, MD  aspirin 81 MG chewable tablet Chew 81 mg by mouth daily.   Yes Historical Provider, MD  Cranberry-Vitamin C-Probiotic (AZO CRANBERRY PO) Take 1 tablet by mouth 2 (two) times daily.   Yes Historical Provider, MD  digoxin (LANOXIN) 0.125 MG tablet Take 0.125 mg by mouth every Monday, Wednesday, and Friday.    Yes Historical Provider, MD  diltiazem (CARDIZEM CD) 240 MG 24 hr capsule Take 240 mg by mouth every other day. Alternates with diltiazem 120mg  24h capsule   Yes Historical Provider, MD  diltiazem (DILACOR XR) 120 MG 24 hr capsule Take 120 mg by mouth every other day. Alternated with diltiazem 24 hr 240 mg capsule   Yes Historical Provider, MD  donepezil (ARICEPT) 5 MG tablet Take 5 mg by mouth at bedtime.   Yes Historical Provider, MD  feeding supplement (ENSURE IMMUNE HEALTH) LIQD Take 237 mLs by mouth daily.   Yes Historical Provider, MD  lisinopril (PRINIVIL,ZESTRIL) 10 MG tablet Take 10 mg by mouth daily.   Yes Historical Provider, MD  LORazepam (ATIVAN) 0.5 MG tablet Take 0.25 mg by mouth at bedtime as needed for anxiety.   Yes Historical Provider, MD  metoprolol tartrate (LOPRESSOR) 25 MG tablet Take 12.5 mg by mouth 2 (two) times daily.   Yes Historical Provider, MD  Neomycin-Bacitracin-Polymyxin (TRIPLE ANTIBIOTIC) [id number] OINT Apply 1 application topically 2 (two) times daily as needed (for dryness).   Yes Historical Provider, MD  NON FORMULARY Rinse perinal area once a week with vinegar wash  Yes Historical Provider, MD  sertraline (ZOLOFT) 25 MG tablet Take 25 mg by mouth daily.   Yes Historical Provider, MD   BP 164/68  Pulse 70  Temp(Src) 98.5 F (36.9 C) (Oral)  Resp 18  SpO2 95% Physical Exam  Nursing note and vitals reviewed. Constitutional: She appears well-developed and well-nourished. No distress.  HENT:  Head: Normocephalic.  Right Ear: External ear normal.  Left Ear: External ear normal.  Small approx 1 cm laceration posterior right scalp, small hematoma   Eyes: Conjunctivae are normal. Right eye exhibits no discharge. Left eye exhibits no discharge. No scleral icterus.  Neck: Neck supple. No tracheal deviation present.  Cardiovascular: Normal rate, regular rhythm and intact distal pulses.   Pulmonary/Chest: Effort normal and breath sounds normal. No  stridor. No respiratory distress. She has no wheezes. She has no rales.  Abdominal: Soft. Bowel sounds are normal. She exhibits no distension. There is no tenderness. There is no rebound and no guarding.  Musculoskeletal: Normal range of motion. She exhibits no edema and no tenderness.       Cervical back: Normal.       Thoracic back: Normal.       Lumbar back: Normal.  Full range of motion all extremities without pain or discomfort  Neurological: She is alert. She has normal strength. She is not disoriented. No cranial nerve deficit (no facial droop, extraocular movements intact, no slurred speech) or sensory deficit. She exhibits normal muscle tone. She displays no seizure activity. Coordination normal.  Alert and aware of her surroundings, speaking without any difficulty,   Skin: Skin is warm and dry. No rash noted.  Psychiatric: She has a normal mood and affect.    ED Course  LACERATION REPAIR Date/Time: 09/24/2013 11:16 AM Performed by: Linwood DibblesKNAPP, Caylor Tallarico Authorized by: Linwood DibblesKNAPP, Liam Bossman Consent: Verbal consent obtained. Risks and benefits: risks, benefits and alternatives were discussed Consent given by: patient Time out: Immediately prior to procedure a "time out" was called to verify the correct patient, procedure, equipment, support staff and site/side marked as required. Body area: head/neck Location details: scalp Laceration length: 1 cm Tendon involvement: none Nerve involvement: none Vascular damage: no Patient sedated: no Irrigation solution: saline Irrigation method: jet lavage Amount of cleaning: standard Debridement: none Skin closure: staples Number of sutures: 1 Patient tolerance: Patient tolerated the procedure well with no immediate complications.   (including critical care time) Labs Review Labs Reviewed  BASIC METABOLIC PANEL - Abnormal; Notable for the following:    Glucose, Bld 107 (*)    GFR calc non Af Amer 72 (*)    GFR calc Af Amer 83 (*)    All other  components within normal limits  CBC WITH DIFFERENTIAL    Imaging Review Ct Head Wo Contrast  09/24/2013   CLINICAL DATA:  Status post head trauma; no definite fall ; laceration. Over the right occipital region.  EXAM: CT HEAD WITHOUT CONTRAST  TECHNIQUE: Contiguous axial images were obtained from the base of the skull through the vertex without intravenous contrast.  COMPARISON:  Noncontrast CT scan of the brain of May 25, 2013  FINDINGS: There is moderate diffuse cerebral and cerebellar atrophy with compensatory ventriculomegaly. There is stable decreased density in the deep white matter of both cerebral hemispheres consistent with chronic small vessel ischemia. There is stable basal ganglia calcification bilaterally. The cerebellum and brainstem are unremarkable. There is no acute intracranial hemorrhage nor acute ischemic change.  The observed paranasal sinuses and mastoid air cells are  clear. There is no acute skull fracture. A sutured laceration over the lateral height calvarium on the right is noted.  IMPRESSION: 1. There is no acute intracranial hemorrhage nor other acute intracranial abnormality. 2. Stable changes of chronic small vessel ischemia. 3. There is no acute skull fracture.   Electronically Signed   By: David  [place]   On: 09/24/2013 11:35     EKG Interpretation   Date/Time:  Wednesday September 24 2013 11:03:02 EDT Ventricular Rate:  69 PR Interval:  166 QRS Duration: 81 QT Interval:  446 QTC Calculation: 478 R Axis:   -45 Text Interpretation:  Ventricular-paced complexes No further rhythm  analysis attempted due to paced rhythm Left anterior fascicular block  Anterior infarct, old Prolonged QT interval Baseline wander in lead(s) II  III aVR aVL aVF V1 No significant change since last tracing Confirmed by  Bernis Stecher  MD-J, Somaya Grassi ([id number]) on 09/24/2013 11:06:31 AM      MDM   Final diagnoses:  Scalp laceration, initial encounter    Pt appears well.  No symptoms or findings  with suggest stroke, infection.  Cannot rule out syncopal event however the laceration is at the top of her head and the scenario of bumping it on something when stood up is likely.  DC back to nursing facility.    Linwood Dibbles, MD 09/24/13 1236

## 2013-10-10 ENCOUNTER — Emergency Department (HOSPITAL_COMMUNITY): Payer: Medicare Other

## 2013-10-10 ENCOUNTER — Encounter (HOSPITAL_COMMUNITY): Payer: Self-pay | Admitting: Emergency Medicine

## 2013-10-10 ENCOUNTER — Emergency Department (HOSPITAL_COMMUNITY)
Admission: EM | Admit: 2013-10-10 | Discharge: 2013-10-10 | Disposition: A | Discharge status: Home / Self Care | Payer: Medicare Other | Attending: Emergency Medicine | Admitting: Emergency Medicine

## 2013-10-10 DIAGNOSIS — S99919A Unspecified injury of unspecified ankle, initial encounter: Principal | ICD-10-CM

## 2013-10-10 DIAGNOSIS — W19XXXA Unspecified fall, initial encounter: Secondary | ICD-10-CM

## 2013-10-10 DIAGNOSIS — H5789 Other specified disorders of eye and adnexa: Secondary | ICD-10-CM | POA: Insufficient documentation

## 2013-10-10 DIAGNOSIS — F0390 Unspecified dementia without behavioral disturbance: Secondary | ICD-10-CM | POA: Diagnosis not present

## 2013-10-10 DIAGNOSIS — Y9289 Other specified places as the place of occurrence of the external cause: Secondary | ICD-10-CM | POA: Insufficient documentation

## 2013-10-10 DIAGNOSIS — E785 Hyperlipidemia, unspecified: Secondary | ICD-10-CM | POA: Diagnosis not present

## 2013-10-10 DIAGNOSIS — Z792 Long term (current) use of antibiotics: Secondary | ICD-10-CM | POA: Diagnosis not present

## 2013-10-10 DIAGNOSIS — S99929A Unspecified injury of unspecified foot, initial encounter: Principal | ICD-10-CM

## 2013-10-10 DIAGNOSIS — Z8673 Personal history of transient ischemic attack (TIA), and cerebral infarction without residual deficits: Secondary | ICD-10-CM | POA: Diagnosis not present

## 2013-10-10 DIAGNOSIS — Y9389 Activity, other specified: Secondary | ICD-10-CM | POA: Insufficient documentation

## 2013-10-10 DIAGNOSIS — R011 Cardiac murmur, unspecified: Secondary | ICD-10-CM | POA: Insufficient documentation

## 2013-10-10 DIAGNOSIS — I10 Essential (primary) hypertension: Secondary | ICD-10-CM | POA: Insufficient documentation

## 2013-10-10 DIAGNOSIS — S8990XA Unspecified injury of unspecified lower leg, initial encounter: Secondary | ICD-10-CM | POA: Insufficient documentation

## 2013-10-10 DIAGNOSIS — I498 Other specified cardiac arrhythmias: Secondary | ICD-10-CM | POA: Insufficient documentation

## 2013-10-10 DIAGNOSIS — I4891 Unspecified atrial fibrillation: Secondary | ICD-10-CM | POA: Insufficient documentation

## 2013-10-10 DIAGNOSIS — M25561 Pain in right knee: Secondary | ICD-10-CM

## 2013-10-10 DIAGNOSIS — Z79899 Other long term (current) drug therapy: Secondary | ICD-10-CM | POA: Diagnosis not present

## 2013-10-10 DIAGNOSIS — Z7982 Long term (current) use of aspirin: Secondary | ICD-10-CM | POA: Insufficient documentation

## 2013-10-10 DIAGNOSIS — R296 Repeated falls: Secondary | ICD-10-CM | POA: Diagnosis not present

## 2013-10-10 DIAGNOSIS — M1990 Unspecified osteoarthritis, unspecified site: Secondary | ICD-10-CM | POA: Diagnosis not present

## 2013-10-10 LAB — BASIC METABOLIC PANEL
Anion gap: 11 (ref 5–15)
BUN: 19 mg/dL (ref 6–23)
CHLORIDE: 101 meq/L (ref 96–112)
CO2: 25 mEq/L (ref 19–32)
Calcium: 9.3 mg/dL (ref 8.4–10.5)
Creatinine, Ser: 0.77 mg/dL (ref 0.50–1.10)
GFR calc Af Amer: 77 mL/min — ABNORMAL LOW (ref >90)
GFR calc non Af Amer: 67 mL/min — ABNORMAL LOW (ref >90)
Glucose, Bld: 99 mg/dL (ref 70–99)
POTASSIUM: 4.1 meq/L (ref 3.7–5.3)
Sodium: 137 mEq/L (ref 137–147)

## 2013-10-10 LAB — CBC
HEMATOCRIT: 37.6 % (ref 36.0–46.0)
HEMOGLOBIN: 12.5 g/dL (ref 12.0–15.0)
MCH: 30.3 pg (ref 26.0–34.0)
MCHC: 33.2 g/dL (ref 30.0–36.0)
MCV: 91.3 fL (ref 78.0–100.0)
Platelets: 321 10*3/uL (ref 150–400)
RBC: 4.12 MIL/uL (ref 3.87–5.11)
RDW: 14.9 % (ref 11.5–15.5)
WBC: 11.1 10*3/uL — AB (ref 4.0–10.5)

## 2013-10-10 LAB — URINALYSIS, ROUTINE W REFLEX MICROSCOPIC
Bilirubin Urine: NEGATIVE
GLUCOSE, UA: NEGATIVE mg/dL
Hgb urine dipstick: NEGATIVE
KETONES UR: NEGATIVE mg/dL
NITRITE: NEGATIVE
Protein, ur: NEGATIVE mg/dL
Specific Gravity, Urine: 1.009 (ref 1.005–1.030)
UROBILINOGEN UA: 0.2 mg/dL (ref 0.0–1.0)
pH: 7 (ref 5.0–8.0)

## 2013-10-10 LAB — URINE MICROSCOPIC-ADD ON

## 2013-10-10 LAB — DIGOXIN LEVEL: Digoxin Level: 0.4 ng/mL — ABNORMAL LOW (ref 0.8–2.0)

## 2013-10-10 MED ORDER — IBUPROFEN 400 MG PO TABS: 200.0000 mg | ORAL_TABLET | Freq: Two times a day (BID) | ORAL | Status: DC | PRN

## 2013-10-10 NOTE — ED Provider Notes (Signed)
CSN: [id number]     Arrival date & time 10/10/13  1540 History   First MD Initiated Contact with Patient 10/10/13 1548     Chief Complaint  Patient presents with  . Fall  . Knee Pain   LEVEL 5 CAVEAT: Pt with baseline dementia, unable to obtain full history, EMS providing some history  (Consider location/radiation/quality/duration/timing/severity/associated sxs/prior Treatment) HPI Comments: Regina Burch is a [age] y.o. Female with a PMHx of afib w/ RVR, aortic regurg, HTN, HLD, carotid stenosis, senile dementia, CVA, and DJD, presenting today via EMS who brings her from her nursing facility s/p fall off the toilet. EMS reports that nursing home states she fell off toilet, hitting her R knee on the ground. Nursing home adamant that pt did not hit head or lose consciousness, but pt unable to provide history due to baseline dementia. Pt endorses R knee pain over the medial aspect, but cannot quantify or qualify the pain. Cannot provide any other historical facts.  Patient is a 80 y.o. female presenting with fall and knee pain. The history is provided by the patient. No language interpreter was used.  Fall This is a recurrent problem. The current episode started today. The problem occurs intermittently. The problem has been unchanged. Associated symptoms include arthralgias (R knee). Pertinent negatives include no joint swelling. Nothing aggravates the symptoms. She has tried nothing for the symptoms. The treatment provided no relief.  Knee Pain   Past Medical History  Diagnosis Date  . Stroke   . A-fib   . Hypertension   . Aortic regurgitation   . Carotid stenosis   . Degenerative joint disease   . Hyperlipemia   . Senile dementia    Past Surgical History  Procedure Laterality Date  . Pacemaker insertion     Family History  Problem Relation Age of Onset  . Stroke Mother    History  Substance Use Topics  . Smoking status: Never Smoker   . Smokeless tobacco: Not on file  .  Alcohol Use: No   OB History   Grav Para Term Preterm Abortions TAB SAB Ect Mult Living                 Review of Systems  Unable to perform ROS: Dementia  Musculoskeletal: Positive for arthralgias (R knee). Negative for joint swelling.      Allergies  Review of patient's allergies indicates no known allergies.  Home Medications   Prior to Admission medications   Medication Sig Start Date End Date Taking? Authorizing Provider  acetaminophen (TYLENOL) 325 MG tablet Take 650 mg by mouth every 6 (six) hours as needed for mild pain or moderate pain.   Yes Historical Provider, MD  aspirin 81 MG chewable tablet Chew 81 mg by mouth daily.   Yes Historical Provider, MD  Cranberry-Vitamin C-Probiotic (AZO CRANBERRY PO) Take 1 tablet by mouth 2 (two) times daily.   Yes Historical Provider, MD  digoxin (LANOXIN) 0.125 MG tablet Take 0.125 mg by mouth every Monday, Wednesday, and Friday.   Yes Historical Provider, MD  diltiazem (CARDIZEM CD) 240 MG 24 hr capsule Take 240 mg by mouth every other day. Alternates with diltiazem 120mg  24h capsule   Yes Historical Provider, MD  diltiazem (DILACOR XR) 120 MG 24 hr capsule Take 120 mg by mouth every other day. Alternated with diltiazem 24 hr 240 mg capsule   Yes Historical Provider, MD  donepezil (ARICEPT) 5 MG tablet Take 5 mg by mouth at bedtime.  Yes Historical Provider, MD  feeding supplement (ENSURE IMMUNE HEALTH) LIQD Take 237 mLs by mouth daily.   Yes Historical Provider, MD  lisinopril (PRINIVIL,ZESTRIL) 10 MG tablet Take 10 mg by mouth daily.   Yes Historical Provider, MD  LORazepam (ATIVAN) 0.5 MG tablet Take 0.25 mg by mouth at bedtime as needed for anxiety.   Yes Historical Provider, MD  metoprolol tartrate (LOPRESSOR) 25 MG tablet Take 12.5 mg by mouth 2 (two) times daily.   Yes Historical Provider, MD  Neomycin-Bacitracin-Polymyxin (TRIPLE ANTIBIOTIC) [phone] OINT Apply 1 application topically 2 (two) times daily as needed (for  dryness).   Yes Historical Provider, MD  NON FORMULARY Rinse perinal area once a week with vinegar wash   Yes Historical Provider, MD  sertraline (ZOLOFT) 25 MG tablet Take 25 mg by mouth See admin instructions. Monday Wednesday and Friday   Yes Historical Provider, MD  tobramycin (TOBREX) 0.3 % ophthalmic solution Place 2 drops into both eyes 2 (two) times daily.   Yes Historical Provider, MD  ibuprofen (ADVIL,MOTRIN) 400 MG tablet Take 0.5 tablets (200 mg total) by mouth every 12 (twelve) hours as needed for mild pain. 10/10/13   Kendel Pesnell Strupp Camprubi-Soms, PA-C   BP 188/73  Pulse 77  Temp(Src) 98.3 F (36.8 C) (Oral)  Resp 16  SpO2 97% Physical Exam  Nursing note and vitals reviewed. Constitutional: Vital signs are normal. She appears well-developed and well-nourished. No distress.  VSS, NAD  HENT:  Head: Normocephalic and atraumatic.  Nose: Nose normal.  Mouth/Throat: Mucous membranes are normal.  Eyes: EOM are normal. Pupils are equal, round, and reactive to light. Right eye exhibits no discharge. Left eye exhibits no discharge. Left conjunctiva is injected.  L eye mildly injected in conjunctiva, no drainage or crusting  Neck: Normal range of motion. Neck supple. No spinous process tenderness and no muscular tenderness present. Carotid bruit is not present. No rigidity. Normal range of motion present.  FROM intact, no spinous process TTP or crepitus, no bony step offs. No carotid bruit  Cardiovascular: Normal rate, S1 normal, S2 normal and intact distal pulses.  An irregularly irregular rhythm present. Exam reveals no gallop and no friction rub.   Murmur heard. Irregularly irregular, ?aortic regurg murmur. Intact distal pulses in all extremities  Pulmonary/Chest: Effort normal and breath sounds normal. No respiratory distress. She has no decreased breath sounds. She has no wheezes. She has no rhonchi. She has no rales.  Abdominal: Soft. Normal appearance and bowel sounds are normal.  She exhibits no distension. There is no tenderness. There is no rigidity, no rebound and no guarding.  Musculoskeletal: Normal range of motion.       Right knee: She exhibits erythema.       Legs: R knee with small erythematous area over anterior patella, TTP along lateral joint line, no varus/valgus laxity, baseline ROM and gait. Sensation at baseline, grossly intact throughout all extremities. Strength 5/5 in all extremities. Cap refill <3 secs, DP/PT pulses equal bilaterally  Neurological: She is alert. She has normal strength. No sensory deficit.  Oriented to person but not to place or time. Baseline strength and sensation in all extremities.   Skin: Skin is warm, dry and intact. No abrasion and no rash noted. There is erythema.  Erythema of R knee as noted above. No lacerations or abrasions over all exposed surfaces  Psychiatric: She has a normal mood and affect. Cognition and memory are impaired.  Impaired memory    ED Course  Procedures (  including critical care time) Labs Review Labs Reviewed  CBC - Abnormal; Notable for the following:    WBC 11.1 (*)    All other components within normal limits  BASIC METABOLIC PANEL - Abnormal; Notable for the following:    GFR calc non Af Amer 67 (*)    GFR calc Af Amer 77 (*)    All other components within normal limits  DIGOXIN LEVEL - Abnormal; Notable for the following:    Digoxin Level 0.4 (*)    All other components within normal limits  URINALYSIS, ROUTINE W REFLEX MICROSCOPIC - Abnormal; Notable for the following:    Leukocytes, UA SMALL (*)    All other components within normal limits  URINE MICROSCOPIC-ADD ON    Imaging Review Ct Head Wo Contrast  10/10/2013   CLINICAL DATA:  Fall. Right knee pain. Patient fell off toilet. No loss of consciousness. No head injury.  EXAM: CT HEAD WITHOUT CONTRAST  TECHNIQUE: Contiguous axial images were obtained from the base of the skull through the vertex without intravenous contrast.   COMPARISON:  09/24/2013  FINDINGS: There is moderate central and cortical atrophy. Periventricular white matter changes are significant but stable and consistent with small vessel disease. There is no evidence for hemorrhage, mass lesion, or acute infarction. Bone windows show significant atherosclerotic calcification of the internal carotid arteries. No calvarial fracture.  IMPRESSION: 1. Atrophy and small vessel disease. 2.  No evidence for acute intracranial abnormality.   Electronically Signed   By: Rosalie Gums M.D.   On: 10/10/2013 17:11   Dg Knee Complete 4 Views Right  10/10/2013   CLINICAL DATA:  Fall and right knee pain.  EXAM: RIGHT KNEE - COMPLETE 4+ VIEW  COMPARISON:  None.  FINDINGS: Diffuse osteopenia in the right knee. There are vascular calcifications. Evidence for chondrocalcinosis, particularly in the lateral knee compartment. No significant joint effusion. The right knee is located without an acute fracture. Mild degenerative changes in the right knee.  IMPRESSION: No acute abnormality in the right knee.  Diffuse osteopenia.   Electronically Signed   By: Richarda Overlie M.D.   On: 10/10/2013 17:24     EKG Interpretation None      MDM   Final diagnoses:  Fall, initial encounter  Knee pain, right  Redness of left eye    80y/o female with dementia at baseline, multiple recent falls, presenting with fall and R knee pain. Unable to obtain full hx, and although nursing home is adamant that pt did not hit head, given that she's on ASA, will CT head and xray knee. Will obtain basic labs and dig level. Reassess after imaging and labs.  8:00 PM U/A with small leuks but no WBC, doubt UTI. CBC WNL, WBC count close to baseline and unconcerning. BMP showing mildly low GFR, likely related to some mild dehydration, no other abnormalities. Dig level mildly low, not concerning for toxicity. Knee and head imaging negative. Will d/c with small supply of ibuprofen, discussed with grandson that tylenol  would be better for pain, and to use this in addition to RICE treatment for pain of her knee. Grandson concerned about pink eye, meds list here states she's being treated with tobramycin drops therefore will defer to nursing home regarding care of her eye. I explained the diagnosis and have given explicit precautions to return to the ER including for any other new or worsening symptoms. The patient understands and accepts the medical plan as it's been dictated and I have  answered their questions. Discharge instructions concerning home care and prescriptions have been given. The patient is STABLE and is discharged to home in good condition.  BP 188/73  Pulse 77  Temp(Src) 98.3 F (36.8 C) (Oral)  Resp 16  SpO2 97%  Meds ordered this encounter  Medications  . ibuprofen (ADVIL,MOTRIN) 400 MG tablet    Sig: Take 0.5 tablets (200 mg total) by mouth every 12 (twelve) hours as needed for mild pain.    Dispense:  6 tablet    Refill:  0    Order Specific Question:  Supervising Provider    Answer:  Vida Roller [address] Camprubi-Soms, PA-C 10/10/13 2118

## 2013-10-10 NOTE — ED Notes (Signed)
Please call pt's grandson when pt ready for discharge, he will take pt back to nursing home.

## 2013-10-10 NOTE — Discharge Instructions (Signed)
Ice and elevate knee throughout the day, then switch to heat in 1-2 days. Use it for 20 minutes at a time, once per hour. Alternate between tylenol and ibuprofen for pain relief but use ibuprofen sparingly, preferably not at all if tylenol works. Continue using the tobrex drops for your left eye redness. Return to the ER for any changes or worsening symptoms.   Arthralgia Arthralgia is joint pain. A joint is a place where two bones meet. Joint pain can happen for many reasons. The joint can be bruised, stiff, infected, or weak from aging. Pain usually goes away after resting and taking medicine for soreness.  HOME CARE  Rest the joint as told by your doctor.  Keep the sore joint raised (elevated) for the first 24 hours.  Put ice on the joint area.  Put ice in a plastic bag.  Place a towel between your skin and the bag.  Leave the ice on for 15-20 minutes, 03-04 times a day.  Wear your splint, casting, elastic bandage, or sling as told by your doctor.  Only take medicine as told by your doctor. Do not take aspirin.  Use crutches as told by your doctor. Do not put weight on the joint until told to by your doctor. GET HELP RIGHT AWAY IF:   You have bruising, puffiness (swelling), or more pain.  Your fingers or toes turn blue or start to lose feeling (numb).  Your medicine does not lessen the pain.  Your pain becomes severe.  You have a temperature by mouth above 102 F (38.9 C), not controlled by medicine.  You cannot move or use the joint. MAKE SURE YOU:   Understand these instructions.  Will watch your condition.  Will get help right away if you are not doing well or get worse. Document Released: 02/08/2009 Document Revised: 05/15/2011 Document Reviewed: 02/08/2009 [facility] Patient Information 2015 [place], [place]. This information is not intended to replace advice given to you by your health care provider. Make sure you discuss any questions you have with your health  care provider.  Cryotherapy Cryotherapy is when you put ice on your injury. Ice helps lessen pain and puffiness (swelling) after an injury. Ice works the best when you start using it in the first 24 to 48 hours after an injury. HOME CARE  Put a dry or damp towel between the ice pack and your skin.  You may press gently on the ice pack.  Leave the ice on for no more than 10 to 20 minutes at a time.  Check your skin after 5 minutes to make sure your skin is okay.  Rest at least 20 minutes between ice pack uses.  Stop using ice when your skin loses feeling (numbness).  Do not use ice on someone who cannot tell you when it hurts. This includes small children and people with memory problems (dementia). GET HELP RIGHT AWAY IF:  You have white spots on your skin.  Your skin turns blue or pale.  Your skin feels waxy or hard.  Your puffiness gets worse. MAKE SURE YOU:   Understand these instructions.  Will watch your condition.  Will get help right away if you are not doing well or get worse. Document Released: 08/09/2007 Document Revised: 05/15/2011 Document Reviewed: 10/13/2010 [facility] Patient Information 2015 [place], [place]. This information is not intended to replace advice given to you by your health care provider. Make sure you discuss any questions you have with your health care provider.  Heat  Therapy Heat therapy can help make painful, stiff muscles and joints feel better. Do not use heat on new injuries. Wait at least 48 hours after an injury to use heat. Do not use heat when you have aches or pains right after an activity. If you still have pain 3 hours after stopping the activity, then you may use heat. HOME CARE Wet heat pack  Soak a clean towel in warm water. Squeeze out the extra water.  Put the warm, wet towel in a plastic bag.  Place a thin, dry towel between your skin and the bag.  Put the heat pack on the area for 5 minutes, and check your skin. Your skin  may be pink, but it should not be red.  Leave the heat pack on the area for 15 to 30 minutes.  Repeat this every 2 to 4 hours while awake. Do not use heat while you are sleeping. Warm water bath  Fill a tub with warm water.  Place the affected body part in the tub.  Soak the area for 20 to 40 minutes.  Repeat as needed. Hot water bottle  Fill the water bottle half full with hot water.  Press out the extra air. Close the cap tightly.  Place a dry towel between your skin and the bottle.  Put the bottle on the area for 5 minutes, and check your skin. Your skin may be pink, but it should not be red.  Leave the bottle on the area for 15 to 30 minutes.  Repeat this every 2 to 4 hours while awake. Electric heating pad  Place a dry towel between your skin and the heating pad.  Set the heating pad on low heat.  Put the heating pad on the area for 10 minutes, and check your skin. Your skin may be pink, but it should not be red.  Leave the heating pad on the area for 20 to 40 minutes.  Repeat this every 2 to 4 hours while awake.  Do not lie on the heating pad.  Do not fall asleep while using the heating pad.  Do not use the heating pad near water. GET HELP RIGHT AWAY IF:  You get blisters or red skin.  Your skin is puffy (swollen), or you lose feeling (numbness) in the affected area.  You have any new problems.  Your problems are getting worse.  You have any questions or concerns. If you have any problems, stop using heat therapy until you see your doctor. MAKE SURE YOU:  Understand these instructions.  Will watch your condition.  Will get help right away if you are not doing well or get worse. Document Released: 05/15/2011 Document Reviewed: 04/15/2013 [facility] Patient Information 2015 [place]. This information is not intended to replace advice given to you by your health care provider. Make sure you discuss any questions you have with your health care  provider.  Fall Prevention and Home Safety Falls cause injuries and can affect all age groups. It is possible to prevent falls.  HOW TO PREVENT FALLS  Wear shoes with rubber soles that do not have an opening for your toes.  Keep the inside and outside of your house well lit.  Use night lights throughout your home.  Remove clutter from floors.  Clean up floor spills.  Remove throw rugs or fasten them to the floor with carpet tape.  Do not place electrical cords across pathways.  Put grab bars by your tub, shower, and toilet.  Do not use towel bars as grab bars.  Put handrails on both sides of the stairway. Fix loose handrails.  Do not climb on stools or stepladders, if possible.  Do not wax your floors.  Repair uneven or unsafe sidewalks, walkways, or stairs.  Keep items you use a lot within reach.  Be aware of pets.  Keep emergency numbers next to the telephone.  Put smoke detectors in your home and near bedrooms. Ask your doctor what other things you can do to prevent falls. Document Released: 12/17/2008 Document Revised: 08/22/2011 Document Reviewed: 05/23/2011 [facility] Patient Information 2015 [place], [place]. This information is not intended to replace advice given to you by your health care provider. Make sure you discuss any questions you have with your health care provider.

## 2013-10-10 NOTE — ED Provider Notes (Signed)
Medical screening examination/treatment/procedure(s) were conducted as a shared visit with non-physician practitioner(s) and myself.  I personally evaluated the patient during the encounter. Pt is a 89yoF with mechanical fall off the toilet, unwittnessed. Given age and ASA use, CT head, c-spine ordered. No acute findings. XR knee also negative. Urine string smelling, but with ouly small leukocytes. Will send for culture. I feel she is safe for d/c home.   EKG Interpretation None        Toy CookeyMegan Randon Somera, MD 10/10/13 2350

## 2013-10-10 NOTE — ED Notes (Signed)
Bed: WA16 Expected date:  Expected time:  Means of arrival:  Comments: EMS fall 

## 2013-10-10 NOTE — ED Notes (Signed)
Pt is in CT, will draw labs when pt returns 

## 2013-10-10 NOTE — ED Notes (Signed)
[facility] and right knee pain. Staff at NH reports pt was on the toilet,  trying to wipe herself when she fell, denies LOC, no head injury. Pt only c/o right knee pain.

## 2014-01-28 ENCOUNTER — Encounter (HOSPITAL_COMMUNITY): Payer: Self-pay | Admitting: *Deleted

## 2014-01-28 ENCOUNTER — Emergency Department (HOSPITAL_COMMUNITY): Payer: Medicare Other

## 2014-01-28 ENCOUNTER — Emergency Department (HOSPITAL_COMMUNITY)
Admission: EM | Admit: 2014-01-28 | Discharge: 2014-01-28 | Disposition: A | Discharge status: Home / Self Care | Payer: Medicare Other | Attending: Emergency Medicine | Admitting: Emergency Medicine

## 2014-01-28 DIAGNOSIS — R011 Cardiac murmur, unspecified: Secondary | ICD-10-CM | POA: Insufficient documentation

## 2014-01-28 DIAGNOSIS — Y9389 Activity, other specified: Secondary | ICD-10-CM | POA: Diagnosis not present

## 2014-01-28 DIAGNOSIS — Z8639 Personal history of other endocrine, nutritional and metabolic disease: Secondary | ICD-10-CM | POA: Diagnosis not present

## 2014-01-28 DIAGNOSIS — W19XXXA Unspecified fall, initial encounter: Secondary | ICD-10-CM

## 2014-01-28 DIAGNOSIS — Z8673 Personal history of transient ischemic attack (TIA), and cerebral infarction without residual deficits: Secondary | ICD-10-CM | POA: Diagnosis not present

## 2014-01-28 DIAGNOSIS — Z950 Presence of cardiac pacemaker: Secondary | ICD-10-CM | POA: Diagnosis not present

## 2014-01-28 DIAGNOSIS — F0390 Unspecified dementia without behavioral disturbance: Secondary | ICD-10-CM | POA: Diagnosis not present

## 2014-01-28 DIAGNOSIS — Z79899 Other long term (current) drug therapy: Secondary | ICD-10-CM | POA: Insufficient documentation

## 2014-01-28 DIAGNOSIS — W1839XA Other fall on same level, initial encounter: Secondary | ICD-10-CM | POA: Insufficient documentation

## 2014-01-28 DIAGNOSIS — Y998 Other external cause status: Secondary | ICD-10-CM | POA: Insufficient documentation

## 2014-01-28 DIAGNOSIS — I4891 Unspecified atrial fibrillation: Secondary | ICD-10-CM | POA: Insufficient documentation

## 2014-01-28 DIAGNOSIS — Y92012 Bathroom of single-family (private) house as the place of occurrence of the external cause: Secondary | ICD-10-CM | POA: Insufficient documentation

## 2014-01-28 DIAGNOSIS — Z043 Encounter for examination and observation following other accident: Secondary | ICD-10-CM | POA: Diagnosis present

## 2014-01-28 DIAGNOSIS — M1990 Unspecified osteoarthritis, unspecified site: Secondary | ICD-10-CM | POA: Diagnosis not present

## 2014-01-28 DIAGNOSIS — Z7982 Long term (current) use of aspirin: Secondary | ICD-10-CM | POA: Diagnosis not present

## 2014-01-28 DIAGNOSIS — I10 Essential (primary) hypertension: Secondary | ICD-10-CM | POA: Diagnosis not present

## 2014-01-28 LAB — COMPREHENSIVE METABOLIC PANEL
ALT: 15 U/L (ref 0–35)
ANION GAP: 15 (ref 5–15)
AST: 24 U/L (ref 0–37)
Albumin: 3.9 g/dL (ref 3.5–5.2)
Alkaline Phosphatase: 102 U/L (ref 39–117)
BUN: 17 mg/dL (ref 6–23)
CO2: 25 mEq/L (ref 19–32)
Calcium: 9.8 mg/dL (ref 8.4–10.5)
Chloride: 101 mEq/L (ref 96–112)
Creatinine, Ser: 0.7 mg/dL (ref 0.50–1.10)
GFR calc Af Amer: 80 mL/min — ABNORMAL LOW (ref >90)
GFR calc non Af Amer: 69 mL/min — ABNORMAL LOW (ref >90)
Glucose, Bld: 108 mg/dL — ABNORMAL HIGH (ref 70–99)
POTASSIUM: 3.9 meq/L (ref 3.7–5.3)
SODIUM: 141 meq/L (ref 137–147)
TOTAL PROTEIN: 8.4 g/dL — AB (ref 6.0–8.3)
Total Bilirubin: 0.6 mg/dL (ref 0.3–1.2)

## 2014-01-28 LAB — URINALYSIS, ROUTINE W REFLEX MICROSCOPIC
Bilirubin Urine: NEGATIVE
Glucose, UA: NEGATIVE mg/dL
Hgb urine dipstick: NEGATIVE
Ketones, ur: NEGATIVE mg/dL
LEUKOCYTES UA: NEGATIVE
Nitrite: NEGATIVE
PROTEIN: 100 mg/dL — AB
SPECIFIC GRAVITY, URINE: 1.015 (ref 1.005–1.030)
UROBILINOGEN UA: 0.2 mg/dL (ref 0.0–1.0)
pH: 6.5 (ref 5.0–8.0)

## 2014-01-28 LAB — CBC WITH DIFFERENTIAL/PLATELET
BASOS ABS: 0 10*3/uL (ref 0.0–0.1)
BASOS PCT: 0 % (ref 0–1)
EOS ABS: 0 10*3/uL (ref 0.0–0.7)
Eosinophils Relative: 0 % (ref 0–5)
HCT: 41.5 % (ref 36.0–46.0)
Hemoglobin: 13.9 g/dL (ref 12.0–15.0)
Lymphocytes Relative: 9 % — ABNORMAL LOW (ref 12–46)
Lymphs Abs: 1.2 10*3/uL (ref 0.7–4.0)
MCH: 30.9 pg (ref 26.0–34.0)
MCHC: 33.5 g/dL (ref 30.0–36.0)
MCV: 92.2 fL (ref 78.0–100.0)
MONOS PCT: 6 % (ref 3–12)
Monocytes Absolute: 0.9 10*3/uL (ref 0.1–1.0)
NEUTROS ABS: 11.7 10*3/uL — AB (ref 1.7–7.7)
Neutrophils Relative %: 85 % — ABNORMAL HIGH (ref 43–77)
PLATELETS: 340 10*3/uL (ref 150–400)
RBC: 4.5 MIL/uL (ref 3.87–5.11)
RDW: 13.8 % (ref 11.5–15.5)
WBC: 13.8 10*3/uL — ABNORMAL HIGH (ref 4.0–10.5)

## 2014-01-28 LAB — URINE MICROSCOPIC-ADD ON

## 2014-01-28 LAB — CBG MONITORING, ED: Glucose-Capillary: 94 mg/dL (ref 70–99)

## 2014-01-28 LAB — PROTIME-INR
INR: 1.12 (ref 0.00–1.49)
PROTHROMBIN TIME: 14.5 s (ref 11.6–15.2)

## 2014-01-28 LAB — DIGOXIN LEVEL: Digoxin Level: 0.5 ng/mL — ABNORMAL LOW (ref 0.8–2.0)

## 2014-01-28 MED ORDER — SODIUM CHLORIDE 0.9 % IV SOLN
20.0000 mL | INTRAVENOUS | Status: DC
  Administered 2014-01-28: 20 mL via INTRAVENOUS

## 2014-01-28 NOTE — ED Notes (Signed)
Bed: [id number] Expected date:  Expected time:  Means of arrival:  Comments: Ems FALL

## 2014-01-28 NOTE — Discharge Instructions (Signed)
Dementia °Dementia is a word that is used to describe problems with the brain and how it works. People with dementia have memory loss. They may also have problems with thinking, speaking, or solving problems. It can affect how they act around people, how they do their job, their mood, and their personality. These changes may not show up for a long time. Family or friends may not notice problems in the early part of this disease. °HOME CARE °The following tips are for the person living with, or caring for, the person with dementia. °Make the home safe. °· Remove locks on bathroom doors. °· Use childproof locks on cabinets where alcohol, cleaning supplies, or chemicals are stored. °· Put outlet covers in electrical outlets. °· Put in childproof locks to keep doors and windows safe. °· Remove stove knobs, or put in safety knobs that shut off on their own. °· Lower the temperature on water heaters. °· Label medicines. Lock them in a safe place. °· Keep knives, lighters, matches, power tools, and guns out of reach or in a safe place. °· Remove objects that might break or can hurt the person. °· Make sure lighting is good inside and outside. °· Put in grab bars if needed. °· Use a device that detects falls or other needs for help. °Lessen confusion. °· Keep familiar objects and people around. °· Use night lights or low lit (dim) lights at night. °· Label objects or areas. °· Use reminders, notes, or directions for daily activities or tasks. °· Keep a simple routine that is the same for waking, meals, bathing, dressing, and bedtime. °· Create a calm and quiet home. °· Put up clocks and calendars. °· Keep emergency numbers and the home address near all phones. °· Help show the different times of day. Open the curtains during the day to let light in. °Speak clearly and directly. °· Choose simple words and short sentences. °· Use a gentle, calm voice. °· Do not interrupt. °· If the person has a hard time finding a word to  use, give them the word or thought. °· Ask 1 question at a time. Give enough time for the person to answer. Repeat the question if the person does not answer. °Do things that lessen restlessness. °· Provide a comfortable bed. °· Have the same bedtime routine every night. °· Have a regular walking and activity schedule. °· Lessen naps during the day. °· Do not let the person drink a lot of caffeine. °· Go to events that are not overwhelming. °Eat well and drink fluids. °· Lessen distractions during meal times and snacks. °· Avoid foods that are too hot or too cold. °· Watch how the person chews and swallows. This is to make sure they do not choke. °Other °· Keep all vision, hearing, dental, and medical visits with the doctor. °· Only give medicines as told by the doctor. °· Watch the person's driving ability. Do not let the person drive if he or she cannot drive safely. °· Use a program that helps find a person if they become missing. You may need to register with this program. °GET HELP RIGHT AWAY IF:  °· A fever of 102° F (38.9° C) develops. °· Confusion develops or gets worse. °· Sleepiness develops or gets worse. °· Staying awake is hard to do. °· New behavior problems start like mood swings, aggression, and seeing things that are not there. °· Problems with balance, speech, or falling develop. °· Problems swallowing develop. °· Any   problems of another sickness develop. MAKE SURE YOU:  Understand these instructions.  Will watch his or her condition.  Will get help right away if he or she is not doing well or gets worse. Document Released: 02/03/2008 Document Revised: 05/15/2011 Document Reviewed: 07/18/2010 [facility] Patient Information 2015 [place], [place]. This information is not intended to replace advice given to you by your health care provider. Make sure you discuss any questions you have with your health care provider.  Fall Prevention and Home Safety Falls cause injuries and can affect all age  groups. It is possible to prevent falls.  HOW TO PREVENT FALLS  Wear shoes with rubber soles that do not have an opening for your toes.  Keep the inside and outside of your house well lit.  Use night lights throughout your home.  Remove clutter from floors.  Clean up floor spills.  Remove throw rugs or fasten them to the floor with carpet tape.  Do not place electrical cords across pathways.  Put grab bars by your tub, shower, and toilet. Do not use towel bars as grab bars.  Put handrails on both sides of the stairway. Fix loose handrails.  Do not climb on stools or stepladders, if possible.  Do not wax your floors.  Repair uneven or unsafe sidewalks, walkways, or stairs.  Keep items you use a lot within reach.  Be aware of pets.  Keep emergency numbers next to the telephone.  Put smoke detectors in your home and near bedrooms. Ask your doctor what other things you can do to prevent falls. Document Released: 12/17/2008 Document Revised: 08/22/2011 Document Reviewed: 05/23/2011 [facility] Patient Information 2015 [place], [place]. This information is not intended to replace advice given to you by your health care provider. Make sure you discuss any questions you have with your health care provider.

## 2014-01-28 NOTE — ED Notes (Signed)
Patient ambulated to bathroom with a lot of assistance.

## 2014-01-28 NOTE — ED Notes (Signed)
Pt's grandson called and stated if pt returns to nursing facility, he will transport her back.  His number is [phone].  He said he needs 30-45 minutes notice.

## 2014-01-28 NOTE — ED Notes (Signed)
Per EMS [facility] [address] following an unwitnessed fall at the facility at some point overnight.  Patient was found on the bathroom floor this morning.  Patient walks with assistance.  Patient denies neck, head and back pain.  Patient is not painful on exam.  She is not immobilized.  Patient has severe dementia and does not remember falling.  Patient's vitals on scene, 160/70, HR 80, RR 16, 96% on RA.  Patient has had no meds today.  EKG indicates LVH, but nothing acutely significant otherwise.

## 2014-01-28 NOTE — ED Provider Notes (Signed)
CSN: [id number]     Arrival date & time 01/28/14  0909 History   First MD Initiated Contact with Patient 01/28/14 0913     Chief Complaint  Patient presents with  . Fall   HPI The patient came to the emergency room for evaluation after being found on the floor in her bathroom. [facility]. Patient has history of severe dementia. Staff were doing morning rounds and found her lying on the bathroom floor. She denies any complaints. She is unable to tell us what occurred or why she was lying on the floor. She does not have any trouble with headache or neck pain. She denies any trouble chest pain or abdominal pain. She denies vomiting or diarrhea. She denies fevers or chills. She denies any difficulty breathing. Past Medical History  Diagnosis Date  . Stroke   . A-fib   . Hypertension   . Aortic regurgitation   . Carotid stenosis   . Degenerative joint disease   . Hyperlipemia   . Senile dementia    Past Surgical History  Procedure Laterality Date  . Pacemaker insertion     Family History  Problem Relation Age of Onset  . Stroke Mother    History  Substance Use Topics  . Smoking status: Never Smoker   . Smokeless tobacco: Not on file  . Alcohol Use: No   OB History    No data available     Review of Systems  All other systems reviewed and are negative.     Allergies  Review of patient's allergies indicates no known allergies.  Home Medications   Prior to Admission medications   Medication Sig Start Date End Date Taking? Authorizing Provider  acetaminophen (TYLENOL) 325 MG tablet Take 650 mg by mouth every 6 (six) hours as needed for mild pain or moderate pain.   Yes Historical Provider, MD  aspirin 81 MG chewable tablet Chew 81 mg by mouth daily.   Yes Historical Provider, MD  Cranberry-Vitamin C-Probiotic (AZO CRANBERRY PO) Take 1 tablet by mouth 2 (two) times daily.   Yes Historical Provider, MD  digoxin (LANOXIN) 0.125 MG  tablet Take 0.125 mg by mouth every Monday, Wednesday, and Friday.   Yes Historical Provider, MD  diltiazem (CARDIZEM CD) 240 MG 24 hr capsule Take 240 mg by mouth every other day. Alternates with diltiazem 120mg  24h capsule   Yes Historical Provider, MD  diltiazem (DILACOR XR) 120 MG 24 hr capsule Take 120 mg by mouth every other day. Alternated with diltiazem 24 hr 240 mg capsule   Yes Historical Provider, MD  donepezil (ARICEPT) 5 MG tablet Take 5 mg by mouth at bedtime.   Yes Historical Provider, MD  feeding supplement (ENSURE IMMUNE HEALTH) LIQD Take 237 mLs by mouth daily.   Yes Historical Provider, MD  lisinopril (PRINIVIL,ZESTRIL) 10 MG tablet Take 10 mg by mouth daily.   Yes Historical Provider, MD  metoprolol tartrate (LOPRESSOR) 25 MG tablet Take 12.5 mg by mouth 2 (two) times daily.   Yes Historical Provider, MD  Neomycin-Bacitracin-Polymyxin (TRIPLE ANTIBIOTIC) [phone] OINT Apply 1 application topically 2 (two) times daily as needed (for dryness).   Yes Historical Provider, MD  ibuprofen (ADVIL,MOTRIN) 400 MG tablet Take 0.5 tablets (200 mg total) by mouth every 12 (twelve) hours as needed for mild pain. Patient not taking: Reported on 01/28/2014 10/10/13   Donnita FallsMercedes Strupp Camprubi-Soms, PA-C   BP 180/76 mmHg  Pulse 92  Temp(Src) 97.5 F (36.4 C) (  Oral)  Resp 16  SpO2 98% Physical Exam  Constitutional: No distress.  Elderly, frail  HENT:  Head: Normocephalic and atraumatic.  Right Ear: External ear normal.  Left Ear: External ear normal.  Eyes: Conjunctivae are normal. Right eye exhibits no discharge. Left eye exhibits no discharge. No scleral icterus.  Neck: Normal range of motion. Neck supple. No tracheal deviation present.  Cardiovascular: Normal rate, regular rhythm and intact distal pulses.   Murmur heard.  Systolic murmur is present  Pulmonary/Chest: Effort normal and breath sounds normal. No stridor. No respiratory distress. She has no wheezes. She has no rales.   Abdominal: Soft. Bowel sounds are normal. She exhibits no distension. There is no tenderness. There is no rebound and no guarding.  Musculoskeletal: She exhibits no edema or tenderness.       Cervical back: Normal.       Thoracic back: Normal.       Lumbar back: Normal.  Neurological: She is alert. She has normal strength. No cranial nerve deficit (no facial droop, extraocular movements intact, no slurred speech) or sensory deficit. She exhibits normal muscle tone. She displays no seizure activity. GCS eye subscore is 4. GCS verbal subscore is 4. GCS motor subscore is 6.  Patient has strong grip strength bilateral upper extremities,  equal strength bilateral lower extremities, able to lift both legs off the bed without difficulty  Skin: Skin is warm and dry. No rash noted. There is pallor.  Psychiatric: She has a normal mood and affect.  Nursing note and vitals reviewed.   ED Course  Procedures (including critical care time) Labs Review Labs Reviewed  CBC WITH DIFFERENTIAL - Abnormal; Notable for the following:    WBC 13.8 (*)    Neutrophils Relative % 85 (*)    Neutro Abs 11.7 (*)    Lymphocytes Relative 9 (*)    All other components within normal limits  COMPREHENSIVE METABOLIC PANEL - Abnormal; Notable for the following:    Glucose, Bld 108 (*)    Total Protein 8.4 (*)    GFR calc non Af Amer 69 (*)    GFR calc Af Amer 80 (*)    All other components within normal limits  URINALYSIS, ROUTINE W REFLEX MICROSCOPIC - Abnormal; Notable for the following:    Protein, ur 100 (*)    All other components within normal limits  DIGOXIN LEVEL - Abnormal; Notable for the following:    Digoxin Level 0.5 (*)    All other components within normal limits  PROTIME-INR  URINE MICROSCOPIC-ADD ON  POCT CBG (FASTING - GLUCOSE)-MANUAL ENTRY  CBG MONITORING, ED    Imaging Review Ct Head Wo Contrast  01/28/2014   CLINICAL DATA:  Unwitnessed fall.  EXAM: CT HEAD WITHOUT CONTRAST  TECHNIQUE:  Contiguous axial images were obtained from the base of the skull through the vertex without intravenous contrast.  COMPARISON:  CT scan of October 10, 2013.  FINDINGS: Bony calvarium appears intact. Moderate diffuse cortical atrophy is noted. Moderate chronic ischemic white matter disease is noted. No mass effect or midline shift is noted. Ventricular size is within normal limits. There is no evidence of mass lesion, hemorrhage or acute infarction.  IMPRESSION: Moderate diffuse cortical atrophy. Moderate chronic ischemic white matter disease. No acute intracranial abnormality seen.   Electronically Signed   By: Roque Lias M.D.   On: 01/28/2014 10:22     EKG Interpretation   Date/Time:  Wednesday January 28 2014 09:25:20 EST Ventricular Rate:  11  PR Interval:    QRS Duration: 85 QT Interval:  381 QTC Calculation: 423 R Axis:   -52 Text Interpretation:  Atrial fibrillation , Left anterior fascicular block  Anterior infarct, old Abnormal T, consider ischemia, lateral leads paced  complexes not present on current ecg Confirmed by Meegan Shanafelt  MD-J, Kobie Whidby ([id number])  on 01/28/2014 9:30:56 AM      MDM   Final diagnoses:  Fall, initial encounter  Dementia, without behavioral disturbance    No abnormalities noted on exam.  Elevated WBC but no other signs of infection on exam or labs.  No significant injuries found.  Pt has been able to stand with some assistance. She appears to be at her baseline confused state.  Stable for dc back to her living facility.    Linwood DibblesJon Crystall Donaldson, MD 01/28/14 [phone]

## 2014-01-28 NOTE — ED Notes (Addendum)
Patient presents to ED with EMS after being found on her bathroom floor at her assisted living facility.  Patient does not complain of pain anywhere - no pain in arms, legs, back, neck or head.  Patient MAE well.  Patient is alert & oriented to baseline according to EMS report from facility.  Pt's lung sounds are clear.  No S3/S4 heard, systolic murmur auscultated.  Patient's abdomen soft and non-tender to palpation.  Bowel sounds heard.

## 2014-07-20 ENCOUNTER — Encounter (HOSPITAL_COMMUNITY): Payer: Self-pay | Admitting: Emergency Medicine

## 2014-07-20 ENCOUNTER — Inpatient Hospital Stay (HOSPITAL_COMMUNITY)
Admission: EM | Admit: 2014-07-20 | Discharge: 2014-07-22 | DRG: 064 | Payer: Medicare Other | Attending: Internal Medicine | Admitting: Internal Medicine

## 2014-07-20 ENCOUNTER — Inpatient Hospital Stay (HOSPITAL_COMMUNITY): Payer: Medicare Other

## 2014-07-20 ENCOUNTER — Emergency Department (HOSPITAL_COMMUNITY): Payer: Medicare Other

## 2014-07-20 ENCOUNTER — Other Ambulatory Visit (HOSPITAL_COMMUNITY): Payer: Self-pay

## 2014-07-20 DIAGNOSIS — E440 Moderate protein-calorie malnutrition: Secondary | ICD-10-CM | POA: Insufficient documentation

## 2014-07-20 DIAGNOSIS — Z950 Presence of cardiac pacemaker: Secondary | ICD-10-CM | POA: Diagnosis present

## 2014-07-20 DIAGNOSIS — I6350 Cerebral infarction due to unspecified occlusion or stenosis of unspecified cerebral artery: Secondary | ICD-10-CM | POA: Diagnosis not present

## 2014-07-20 DIAGNOSIS — E785 Hyperlipidemia, unspecified: Secondary | ICD-10-CM | POA: Diagnosis present

## 2014-07-20 DIAGNOSIS — Z7982 Long term (current) use of aspirin: Secondary | ICD-10-CM | POA: Diagnosis not present

## 2014-07-20 DIAGNOSIS — I639 Cerebral infarction, unspecified: Secondary | ICD-10-CM | POA: Diagnosis present

## 2014-07-20 DIAGNOSIS — Z515 Encounter for palliative care: Secondary | ICD-10-CM

## 2014-07-20 DIAGNOSIS — R0600 Dyspnea, unspecified: Secondary | ICD-10-CM

## 2014-07-20 DIAGNOSIS — F0390 Unspecified dementia without behavioral disturbance: Secondary | ICD-10-CM | POA: Diagnosis present

## 2014-07-20 DIAGNOSIS — I63412 Cerebral infarction due to embolism of left middle cerebral artery: Secondary | ICD-10-CM | POA: Diagnosis present

## 2014-07-20 DIAGNOSIS — G8191 Hemiplegia, unspecified affecting right dominant side: Secondary | ICD-10-CM | POA: Diagnosis present

## 2014-07-20 DIAGNOSIS — I482 Chronic atrial fibrillation, unspecified: Secondary | ICD-10-CM

## 2014-07-20 DIAGNOSIS — I6789 Other cerebrovascular disease: Secondary | ICD-10-CM | POA: Diagnosis not present

## 2014-07-20 DIAGNOSIS — Z66 Do not resuscitate: Secondary | ICD-10-CM | POA: Diagnosis present

## 2014-07-20 DIAGNOSIS — Z681 Body mass index (BMI) 19 or less, adult: Secondary | ICD-10-CM

## 2014-07-20 DIAGNOSIS — I160 Hypertensive urgency: Secondary | ICD-10-CM | POA: Diagnosis present

## 2014-07-20 DIAGNOSIS — M1990 Unspecified osteoarthritis, unspecified site: Secondary | ICD-10-CM | POA: Diagnosis present

## 2014-07-20 DIAGNOSIS — G9349 Other encephalopathy: Secondary | ICD-10-CM | POA: Diagnosis present

## 2014-07-20 DIAGNOSIS — I10 Essential (primary) hypertension: Secondary | ICD-10-CM | POA: Diagnosis present

## 2014-07-20 LAB — DIFFERENTIAL
Basophils Absolute: 0.1 10*3/uL (ref 0.0–0.1)
Basophils Relative: 1 % (ref 0–1)
Eosinophils Absolute: 0.3 10*3/uL (ref 0.0–0.7)
Eosinophils Relative: 2 % (ref 0–5)
LYMPHS ABS: 5.5 10*3/uL — AB (ref 0.7–4.0)
LYMPHS PCT: 42 % (ref 12–46)
Monocytes Absolute: 1.1 10*3/uL — ABNORMAL HIGH (ref 0.1–1.0)
Monocytes Relative: 8 % (ref 3–12)
Neutro Abs: 6.2 10*3/uL (ref 1.7–7.7)
Neutrophils Relative %: 47 % (ref 43–77)

## 2014-07-20 LAB — COMPREHENSIVE METABOLIC PANEL
ALT: 18 U/L (ref 14–54)
AST: 29 U/L (ref 15–41)
Albumin: 3.7 g/dL (ref 3.5–5.0)
Alkaline Phosphatase: 97 U/L (ref 38–126)
Anion gap: 10 (ref 5–15)
BUN: 16 mg/dL (ref 6–20)
CO2: 23 mmol/L (ref 22–32)
Calcium: 9.3 mg/dL (ref 8.9–10.3)
Chloride: 107 mmol/L (ref 101–111)
Creatinine, Ser: 0.79 mg/dL (ref 0.44–1.00)
GFR calc Af Amer: >60 mL/min (ref >60)
GFR calc non Af Amer: >60 mL/min (ref >60)
Glucose, Bld: 126 mg/dL — ABNORMAL HIGH (ref 65–99)
Potassium: 4.3 mmol/L (ref 3.5–5.1)
SODIUM: 140 mmol/L (ref 135–145)
Total Bilirubin: 0.5 mg/dL (ref 0.3–1.2)
Total Protein: 8.4 g/dL — ABNORMAL HIGH (ref 6.5–8.1)

## 2014-07-20 LAB — CBG MONITORING, ED
GLUCOSE-CAPILLARY: 126 mg/dL — AB (ref 65–99)
Glucose-Capillary: 182 mg/dL — ABNORMAL HIGH (ref 65–99)

## 2014-07-20 LAB — CBC
HCT: 41.6 % (ref 36.0–46.0)
Hemoglobin: 14.1 g/dL (ref 12.0–15.0)
MCH: 30.5 pg (ref 26.0–34.0)
MCHC: 33.9 g/dL (ref 30.0–36.0)
MCV: 90 fL (ref 78.0–100.0)
PLATELETS: 317 10*3/uL (ref 150–400)
RBC: 4.62 MIL/uL (ref 3.87–5.11)
RDW: 14 % (ref 11.5–15.5)
WBC: 13.2 10*3/uL — ABNORMAL HIGH (ref 4.0–10.5)

## 2014-07-20 LAB — I-STAT CHEM 8, ED
BUN: 22 mg/dL — ABNORMAL HIGH (ref 6–20)
CALCIUM ION: 1.13 mmol/L (ref 1.13–1.30)
Chloride: 105 mmol/L (ref 101–111)
Creatinine, Ser: 0.7 mg/dL (ref 0.44–1.00)
GLUCOSE: 128 mg/dL — AB (ref 65–99)
HCT: 46 % (ref 36.0–46.0)
Hemoglobin: 15.6 g/dL — ABNORMAL HIGH (ref 12.0–15.0)
Potassium: 4.2 mmol/L (ref 3.5–5.1)
Sodium: 140 mmol/L (ref 135–145)
TCO2: 22 mmol/L (ref 0–100)

## 2014-07-20 LAB — ETHANOL

## 2014-07-20 LAB — TROPONIN I: TROPONIN I: 0.13 ng/mL — AB (ref <0.031)

## 2014-07-20 LAB — APTT: aPTT: 20 seconds — ABNORMAL LOW (ref 24–37)

## 2014-07-20 LAB — I-STAT TROPONIN, ED: TROPONIN I, POC: 0.17 ng/mL — AB (ref 0.00–0.08)

## 2014-07-20 LAB — PROTIME-INR
INR: 1.02 (ref 0.00–1.49)
Prothrombin Time: 13.6 seconds (ref 11.6–15.2)

## 2014-07-20 LAB — TSH: TSH: 1.238 u[IU]/mL (ref 0.350–4.500)

## 2014-07-20 MED ORDER — ASPIRIN 300 MG RE SUPP
300.0000 mg | Freq: Every day | RECTAL | Status: DC
  Filled 2014-07-20: qty 1

## 2014-07-20 MED ORDER — SENNOSIDES-DOCUSATE SODIUM 8.6-50 MG PO TABS: 1.0000 | ORAL_TABLET | Freq: Every evening | ORAL | Status: DC | PRN

## 2014-07-20 MED ORDER — HYDRALAZINE HCL 20 MG/ML IJ SOLN
5.0000 mg | Freq: Four times a day (QID) | INTRAMUSCULAR | Status: DC | PRN
  Administered 2014-07-20 – 2014-07-21 (×2): 5 mg via INTRAVENOUS
  Filled 2014-07-20 (×2): qty 1

## 2014-07-20 MED ORDER — ACETAMINOPHEN 325 MG PO TABS: 650.0000 mg | ORAL_TABLET | ORAL | Status: DC | PRN

## 2014-07-20 MED ORDER — ENOXAPARIN SODIUM 40 MG/0.4ML ~~LOC~~ SOLN
40.0000 mg | SUBCUTANEOUS | Status: DC
  Administered 2014-07-20: 40 mg via SUBCUTANEOUS
  Filled 2014-07-20: qty 0.4

## 2014-07-20 MED ORDER — ASPIRIN EC 81 MG PO TBEC: 81.0000 mg | DELAYED_RELEASE_TABLET | Freq: Every day | ORAL | Status: DC

## 2014-07-20 MED ORDER — ASPIRIN 300 MG RE SUPP
300.0000 mg | Freq: Once | RECTAL | Status: AC
  Administered 2014-07-20: 300 mg via RECTAL
  Filled 2014-07-20: qty 1

## 2014-07-20 MED ORDER — METOPROLOL TARTRATE 1 MG/ML IV SOLN
2.5000 mg | Freq: Four times a day (QID) | INTRAVENOUS | Status: DC
  Administered 2014-07-20 – 2014-07-21 (×4): 2.5 mg via INTRAVENOUS
  Filled 2014-07-20 (×4): qty 5

## 2014-07-20 MED ORDER — LABETALOL HCL 5 MG/ML IV SOLN
10.0000 mg | Freq: Once | INTRAVENOUS | Status: AC
  Administered 2014-07-20: 10 mg via INTRAVENOUS
  Filled 2014-07-20: qty 4

## 2014-07-20 MED ORDER — STROKE: EARLY STAGES OF RECOVERY BOOK
Freq: Once | Status: AC
  Administered 2014-07-20: 17:00:00

## 2014-07-20 MED ORDER — ACETAMINOPHEN 650 MG RE SUPP: 650.0000 mg | RECTAL | Status: DC | PRN

## 2014-07-20 NOTE — H&P (Signed)
History and Physical  Regina Burch NWG:[id number] DOB: 10/05/1911 DOA: 07/20/2014   PCP: Caffie DammeSMITH, KARLA, MD  Referring Physician: ED/ Dr. Bridgett Larssonhris Post  Chief Complaint: Code stroke  HPI:  81 year old female with a history of hypertension, chronic atrial fibrillation with PPM, carotid stenosis, hyperlipidemia, previous stroke without residual deficits, and dementia brought in to the hospital from her assisted living facility due to acute onset of decreased responsiveness when she was eating lunch. The patient was noted to have right facial droop with right hemiparesis when she was eating her lunch. She then became unable to talk. EMS was activated. The stroke was activated and the patient was seen by neurology. Time last known well was 12pm, but per the family's wishes, no TPA was given. At the time of my evaluation, the patient intimately opens her eyes, but is unable to speak. All of this history is obtained from review of the medical record is speaking with the patient's grandson and the ED physician. Prior to today's events, the patient had in her usual state of health without any recent chest pain, shortness breath, vomiting, diarrhea, headaches.  In the emergency department, CT of the brain was negative for any acute findings. The patient was afebrile and hemodynamically stable. In fact, the patient was very hypertensive with a blood pressure up to 226/129. EKG showed age for ablation with nonspecific ST changes. Point-of-care troponin was 0.17. INR was 1.02. Assessment/Plan: Acute ischemic stroke -Suspect acute left MCA territory infarct -Unable to perform MRI secondary to PPM -Repeat CT in 24 hours -Lipid panel, hemoglobin A1c -Echocardiogram -Carotid ultrasound -Continue aspirin -Neurochecks -Neurology is following patient Acute encephalopathy -Due to the patient's large territory stroke -Check TSH -UA and urine culture Chronic atrial fibrillation -Patient was not on  anticoagulation due to comorbidities, advanced age, and high risk of falls -Deferred decision of warfarin to neurology -CHADS-VASc=5 -Continue digoxin Hypertension -Allow permissive hypertension in the first 24 hours -Start IV Lopressor primarily for rate control of atrial fibrillation-hold for SBP<160 -Hold lisinopril and diltiazem for the first 24 hours Dementia -Previously stable prior to this admission Elevated point-of-care troponin  -Cycle serum troponins       Past Medical History  Diagnosis Date  . Stroke   . A-fib   . Hypertension   . Aortic regurgitation   . Carotid stenosis   . Degenerative joint disease   . Hyperlipemia   . Senile dementia    Past Surgical History  Procedure Laterality Date  . Pacemaker insertion     Social History:  reports that she has never smoked. She does not have any smokeless tobacco history on file. She reports that she does not drink alcohol or use illicit drugs.   Family History  Problem Relation Age of Onset  . Stroke Mother      No Known Allergies    Prior to Admission medications   Medication Sig Start Date End Date Taking? Authorizing Provider  acetaminophen (TYLENOL) 325 MG tablet Take 650 mg by mouth every 6 (six) hours as needed for mild pain or moderate pain.   Yes Historical Provider, MD  aspirin 81 MG chewable tablet Chew 81 mg by mouth daily.   Yes Historical Provider, MD  Cranberry-Vitamin C-Probiotic (AZO CRANBERRY PO) Take 1 tablet by mouth 2 (two) times daily.   Yes Historical Provider, MD  digoxin (LANOXIN) 0.125 MG tablet Take 0.125 mg by mouth every Monday, Wednesday, and Friday.   Yes Historical Provider, MD  diltiazem (  CARDIZEM CD) 240 MG 24 hr capsule Take 240 mg by mouth every other day. Alternates with diltiazem  24h capsule   Yes Historical Provider, MD  diltiazem (DILACOR XR) 120 MG 24 hr capsule Take 120 mg by mouth every other day. Alternated with diltiazem 24 hr 240 mg capsule   Yes Historical  Provider, MD  donepezil (ARICEPT) 5 MG tablet Take 5 mg by mouth at bedtime.   Yes Historical Provider, MD  feeding supplement (ENSURE IMMUNE HEALTH) LIQD Take 237 mLs by mouth daily.   Yes Historical Provider, MD  lisinopril (PRINIVIL,ZESTRIL) 10 MG tablet Take 10 mg by mouth daily.   Yes Historical Provider, MD  metoprolol tartrate (LOPRESSOR) 25 MG tablet Take 12.5 mg by mouth 2 (two) times daily.   Yes Historical Provider, MD  Neomycin-Bacitracin-Polymyxin (TRIPLE ANTIBIOTIC) [phone] OINT Apply 1 application topically 2 (two) times daily as needed (for dryness).   Yes Historical Provider, MD  ibuprofen (ADVIL,MOTRIN) 400 MG tablet Take 0.5 tablets (200 mg total) by mouth every 12 (twelve) hours as needed for mild pain. Patient not taking: Reported on 01/28/2014 10/10/13   Mercedes Camprubi-Soms, PA-C    Review of Systems:  Unobtainable secondary to the patient's acute encephalopathy  Physical Exam: Filed Vitals:   07/20/14 1500 07/20/14 1515 07/20/14 1619 07/20/14 1757  BP: 196/113 193/104 197/106 165/91  Pulse: 100 93 97 97  Temp:   97.8 F (36.6 C) 97.7 F (36.5 C)  TempSrc:   Axillary Axillary  Resp: Height:      Weight:      SpO2: 97% 96% 95% 96%   General:  Opens eyes intermittenly, NAD, nontoxic Head/Eye: No conjunctival hemorrhage, no icterus, [place]/AT, No nystagmus ENT:  No icterus,  No thrush; no meningismus Neck:  No masses, no lymphadenpathy CV:  IRRR, no rub, no gallop, no S3 Lung:  CTAB, good air movement, no wheeze, no rhonchi Abdomen: soft/NT, +BS, nondistended, no peritoneal signs Ext: No cyanosis, No rashes, No petechiae, No lymphangitis, No edema   Labs on Admission:  Basic Metabolic Panel:  Recent Labs Lab 07/20/14 1317 07/20/14 1324  NA 140 140  K 4.3 4.2  CL 107 105  CO2 23  --   GLUCOSE 126* 128*  BUN 16 22*  CREATININE 0.79 0.70  CALCIUM 9.3  --    Liver Function Tests:  Recent Labs Lab 07/20/14 1317  AST 29  ALT 18    ALKPHOS 97  BILITOT 0.5  PROT 8.4*  ALBUMIN 3.7   No results for input(s): LIPASE, AMYLASE in the last 168 hours. No results for input(s): AMMONIA in the last 168 hours. CBC:  Recent Labs Lab 07/20/14 1317 07/20/14 1324  WBC 13.2*  --   NEUTROABS 6.2  --   HGB 14.1 15.6*  HCT 41.6 46.0  MCV 90.0  --   PLT 317  --    Cardiac Enzymes: No results for input(s): CKTOTAL, CKMB, CKMBINDEX, TROPONINI in the last 168 hours. BNP: Invalid input(s): POCBNP CBG:  Recent Labs Lab 07/20/14 1335 07/20/14 1558  GLUCAP 126* 182*    Radiological Exams on Admission: Ct Head Wo Contrast  07/20/2014   CLINICAL DATA:  Right-sided weakness with facial droop  EXAM: CT HEAD WITHOUT CONTRAST  TECHNIQUE: Contiguous axial images were obtained from the base of the skull through the vertex without intravenous contrast.  COMPARISON:  01/28/2014  FINDINGS: Bony calvarium is intact. No findings to suggest acute hemorrhage, acute infarction or space-occupying mass  lesion are noted. Mild atrophic changes and chronic white matter ischemic change are seen. Old lacune or infarct is noted in the anterior aspect of the left thalamus.  IMPRESSION: Chronic white matter ischemic change and atrophy, stable from the prior exam.  These results were called by telephone at the time of interpretation on 07/20/2014 at 1:35 pm to Dr. Leroy KennedyAMILO, who verbally acknowledged these results.   Electronically Signed   By: Alcide CleverMark  Lukens M.D.   On: 07/20/2014 13:35   Dg Chest Port 1 View  07/20/2014   CLINICAL DATA:  [age] old female. History of dyspnea and hypertension with stroke.  EXAM: PORTABLE CHEST - 1 VIEW  COMPARISON:  05/25/2013, 12/07/2012  FINDINGS: Cardiomediastinal silhouette unchanged in size and contour. Right rotation the patient somewhat limits evaluation.  Calcifications of the aortic arch.  Unchanged position of left chest wall cardiac pacing device with 2 leads in place.  No pleural effusion or pneumothorax.   No confluent airspace disease.  Chronic interstitial opacities.  Nodule at the left base unchanged, likely calcified granuloma.  Calcifications of the carotid arteries.  No displaced fracture  IMPRESSION: No radiographic evidence of acute cardiopulmonary disease with a background of chronic changes.  Atherosclerosis.  Signed,  Yvone NeuJaime S. Loreta AveWagner, DO  [facility]  [facility]   Electronically Signed   By: Gilmer MorJaime  Wagner D.O.   On: 07/20/2014 16:48    EKG: Independently reviewed. Atrophic fibrillation with nonspecific ST changes    Time spent:60 minutes Code Status:   DNR Family Communication:   Grandson updated at bedside   Yamira Papa, DO  [facility] Pager [phone]  If 7PM-7AM, please contact night-coverage [email or web address] Password [email or web address] 07/20/2014, 6:20 PM

## 2014-07-20 NOTE — Code Documentation (Signed)
[age][id number]  [facility]  Per staff patient was LKW at 1200 while in the cafeteria for lunch.  Staff handed out trays and noticed patient to be drooling and unable to use right arm.  EMS called and activated Code Stroke.  Patient with dementia at baseline.  She is wheelchair bound and requires assistance with dressing and transfers, but able to communicate and feed herself.  Stroke team at the bedside on arrival.  Labs drawn and patient cleared by Dr. Criss AlvineGoldston.  Patient to CT.  Dr. Leroy Kennedyamilo to CT.  NIHSS 25, see documentation for details and code stroke times.  Patient is nonverbal and has no spontaneous movement in the right arm.  Neuro PA spoke with patient's grandson via telephone who declined treatment with tPA.  No acute stroke treatment at this time per Dr. Leroy Kennedyamilo.  Patient is hypertensive requiring Labetalol IVP.  Bedside handoff with ED RN Tammy SoursGreg.

## 2014-07-20 NOTE — Progress Notes (Signed)
Patient arrived from ED via stretcher. Patient noted nonverbal, appears in no distress. PEr ED RN, grandson will return shortly. TELE applied and confirmed. Will continue to monitor.   Sim BoastHavy, RN

## 2014-07-20 NOTE — ED Notes (Signed)
Family arrived speaking to Neurologist at bedside.

## 2014-07-20 NOTE — Consult Note (Addendum)
Referring Physician: Dr .Criss AlvineGoldston    Chief Complaint: code stroke, right facial droop, right hemiparesis, decreased responsiveness, aphasia  HPI:                                                                                                                                         Regina Burch is an [age] y.o. female with a past medical history that is relevant for HTN, hyperlipidemia, atrial fibrillation no taking anticoagulant, s/p pacemaker placement, ischemic stroke without residual deficits, and dementia, brought in via EMS due to acute onset of the above stated symptoms. Patient is from an assisted living facility, and at baseline is able to dress, eat, and transfer from her wheelchair to bed. Today, she was eating lunch when suddenly became less responsive, with right face droopiness, and started leaning to the right side, unable to talk, and therefore EMS was summoned. Upon arrival to the ED patient was awake, non verbal, NIHSS CT brain was personally reviewed and showed no acute abnormality.   Date last known well: 07/20/14 Time last known well: 12 pm tPA Given: no, patient and family wishes NIHSS: 25   Past Medical History  Diagnosis Date  . Stroke   . A-fib   . Hypertension   . Aortic regurgitation   . Carotid stenosis   . Degenerative joint disease   . Hyperlipemia   . Senile dementia     Past Surgical History  Procedure Laterality Date  . Pacemaker insertion      Family History  Problem Relation Age of Onset  . Stroke Mother    Social History:  reports that she has never smoked. She does not have any smokeless tobacco history on file. She reports that she does not drink alcohol or use illicit drugs. Family history: unable to obtain due to mental status Allergies: No Known Allergies  Medications:                                                                                                                           I have reviewed the patient's current  medications.  ROS: unable to obtain due to mental status  History obtained from chart review  Physical exam: awake but aphasic. PP 226/126 P 106 and irregular R 17 afebrile Head: normocephalic. Neck: supple, no bruits, no JVD. Cardiac: no murmurs. Lungs: clear. Abdomen: soft, no tender, no mass. Extremities: no edema. Skin: no rash  Neurologic Examination:                                                                                                      General: Mental Status: Awake but globally aphasic. Cranial Nerves: II: Discs flat bilaterally; Visual fields grossly impaired in the right side, pupils equal, round, reactive to light. III,IV, VI: ptosis not present, extra-ocular motions intact bilaterally V,VII: smile asymmetric due to right face weakness, facial light touch sensation unreliable to test VIII: hearing no tested IX,X: uvula rises symmetrically XI: bilateral shoulder shrug unable to test XII: midline tongue extension without atrophy or fasciculations Motor: Dense right arm>right LE weakness  Tone: diminished right UE Sensory: Pinprick and light touch intact throughout, bilaterally Deep Tendon Reflexes:  Right: Upper Extremity   Left: Upper extremity   biceps (C-5 to C-6) 2/4   biceps (C-5 to C-6) 2/4 tricep (C7) 2/4    triceps (C7) 2/4 Brachioradialis (C6) 2/4  Brachioradialis (C6) 2/4  Lower Extremity Lower Extremity  quadriceps (L-2 to L-4) 2/4   quadriceps (L-2 to L-4) 2/4 Achilles (S1) 2/4   Achilles (S1) 2/4 Plantars: Right: upgoing   Left: downgoing Cerebellar: Unable to test as patient has receptive aphasia. Gait: Unable to test due to multiple leads   Results for orders placed or performed during the hospital encounter of 07/20/14 (from the past 48 hour(s))  CBC     Status: Abnormal   Collection Time: 07/20/14  1:17 PM  Result Value Ref Range   WBC 13.2  (H) 4.0 - 10.5 K/uL   RBC 4.62 3.87 - 5.11 MIL/uL   Hemoglobin 14.1 12.0 - 15.0 g/dL   HCT 19.141.6 47.836.0 - 29.546.0 %   MCV 90.0 78.0 - 100.0 fL   MCH 30.5 26.0 - 34.0 pg   MCHC 33.9 30.0 - 36.0 g/dL   RDW 62.114.0 30.811.5 - 65.715.5 %   Platelets 317 150 - 400 K/uL  Differential     Status: Abnormal   Collection Time: 07/20/14  1:17 PM  Result Value Ref Range   Neutrophils Relative % 47 43 - 77 %   Neutro Abs 6.2 1.7 - 7.7 K/uL   Lymphocytes Relative 42 12 - 46 %   Lymphs Abs 5.5 (H) 0.7 - 4.0 K/uL   Monocytes Relative 8 3 - 12 %   Monocytes Absolute 1.1 (H) 0.1 - 1.0 K/uL   Eosinophils Relative 2 0 - 5 %   Eosinophils Absolute 0.3 0.0 - 0.7 K/uL   Basophils Relative 1 0 - 1 %   Basophils Absolute 0.1 0.0 - 0.1 K/uL  I[facility]stat troponin, ED ([facility])     Status: Abnormal   Collection Time: 07/20/14  1:22 PM  Result Value Ref Range   Troponin i, poc 0.17 (HH) 0.00 - 0.08 ng/mL  Comment NOTIFIED PHYSICIAN    Comment 3            Comment: Due to the release kinetics of cTnI, a negative result within the first hours of the onset of symptoms does not rule out myocardial infarction with certainty. If myocardial infarction is still suspected, repeat the test at appropriate intervals.   I-Stat Chem 8, ED  (not at [facility], [facility])     Status: Abnormal   Collection Time: 07/20/14  1:24 PM  Result Value Ref Range   Sodium 140 135 - 145 mmol/L   Potassium 4.2 3.5 - 5.1 mmol/L   Chloride 105 101 - 111 mmol/L   BUN 22 (H) 6 - 20 mg/dL   Creatinine, Ser 4.54 0.44 - 1.00 mg/dL   Glucose, Bld 098 (H) 65 - 99 mg/dL   Calcium, Ion 1.19 1.47 - 1.30 mmol/L   TCO2 22 0 - 100 mmol/L   Hemoglobin 15.6 (H) 12.0 - 15.0 g/dL   HCT 82.9 56.2 - 13.0 %  CBG monitoring, ED     Status: Abnormal   Collection Time: 07/20/14  1:35 PM  Result Value Ref Range   Glucose-Capillary 126 (H) 65 - 99 mg/dL   No results found.   Assessment: 81 y.o. female brought in as a code stroke due to acute onset right hemiparesis,  decreased responsiveness, right face weakness, and global aphasia. NIHSS 25, CT brain without acute abnormality. Suspect large dominant embolic left MCA territory infarct in the context of atrial fibrillation. Patient within the window for thrombolysis but spoke to grandson who manifested patient and wishes of not pursuing aggressive medical management if confronted with a situation like this. Patient will be admitted to medicine for further evaluation and management. She has a pacemaker and thus can not have MRI. No need for CUS at this age. Aspirin when able to swallow. Follow up CT head in 24 hours. Stroke team will follow up tomorrow.  Stroke Risk Factors - age, HTN, hyperlipidemia, atrial fibrillation, s/p pacemaker placement,  prior stroke  Plan: 1. HgbA1c, fasting lipid panel 2. MRI, MRA  of the brain without contrast (unable to perform due pacemaker) 3. Echocardiogram 4. Carotid dopplers (will defer) 5. Prophylactic therapy-aspirin after passing swallowing evaluation 6. Risk factor modification 7. Telemetry monitoring 8. Frequent neuro checks 9. PT/OT SLP   Wyatt Portela, MD [facility]

## 2014-07-20 NOTE — Progress Notes (Signed)
Patient's BP's continually high. Pulse in 120's at times. PRN hydralazine given. MD paged. Will continue to monitor. Aylla Huffine, [place] ScrapeSarah E, RN

## 2014-07-20 NOTE — ED Provider Notes (Signed)
CSN: [id number]     Arrival date & time 07/20/14  1313 History   First MD Initiated Contact with Patient 07/20/14 1315     Chief Complaint  Patient presents with  . Code Stroke     (Consider location/radiation/quality/duration/timing/severity/associated sxs/prior Treatment) HPI Comments: 81 year old female onset of facial droop and confusion at 12:30 PM (45 minutes prior to arrival). Was otherwise well in usual state of health. No other information is available at this time. Level V exception to history secondary acuity  Patient is a [age] y.o. female presenting with neurologic complaint.  Neurologic Problem This is a new problem. The problem has been unchanged. Nothing aggravates the symptoms. She has tried nothing for the symptoms. The treatment provided no relief.    Past Medical History  Diagnosis Date  . Stroke   . A-fib   . Hypertension   . Aortic regurgitation   . Carotid stenosis   . Degenerative joint disease   . Hyperlipemia   . Senile dementia    Past Surgical History  Procedure Laterality Date  . Pacemaker insertion     Family History  Problem Relation Age of Onset  . Stroke Mother    History  Substance Use Topics  . Smoking status: Never Smoker   . Smokeless tobacco: Not on file  . Alcohol Use: No   OB History    No data available     Review of Systems  Unable to perform ROS: Acuity of condition  All other systems reviewed and are negative.     Allergies  Review of patient's allergies indicates no known allergies.  Home Medications   Prior to Admission medications   Medication Sig Start Date End Date Taking? Authorizing Provider  acetaminophen (TYLENOL) 325 MG tablet Take 650 mg by mouth every 6 (six) hours as needed for mild pain or moderate pain.   Yes Historical Provider, MD  aspirin 81 MG chewable tablet Chew 81 mg by mouth daily.   Yes Historical Provider, MD  Cranberry-Vitamin C-Probiotic (AZO CRANBERRY PO) Take 1 tablet by mouth 2 (two)  times daily.   Yes Historical Provider, MD  digoxin (LANOXIN) 0.125 MG tablet Take 0.125 mg by mouth every Monday, Wednesday, and Friday.   Yes Historical Provider, MD  diltiazem (CARDIZEM CD) 240 MG 24 hr capsule Take 240 mg by mouth every other day. Alternates with diltiazem 120mg  24h capsule   Yes Historical Provider, MD  diltiazem (DILACOR XR) 120 MG 24 hr capsule Take 120 mg by mouth every other day. Alternated with diltiazem 24 hr 240 mg capsule   Yes Historical Provider, MD  donepezil (ARICEPT) 5 MG tablet Take 5 mg by mouth at bedtime.   Yes Historical Provider, MD  feeding supplement (ENSURE IMMUNE HEALTH) LIQD Take 237 mLs by mouth daily.   Yes Historical Provider, MD  lisinopril (PRINIVIL,ZESTRIL) 10 MG tablet Take 10 mg by mouth daily.   Yes Historical Provider, MD  metoprolol tartrate (LOPRESSOR) 25 MG tablet Take 12.5 mg by mouth 2 (two) times daily.   Yes Historical Provider, MD  Neomycin-Bacitracin-Polymyxin (TRIPLE ANTIBIOTIC) [phone] OINT Apply 1 application topically 2 (two) times daily as needed (for dryness).   Yes Historical Provider, MD  ibuprofen (ADVIL,MOTRIN) 400 MG tablet Take 0.5 tablets (200 mg total) by mouth every 12 (twelve) hours as needed for mild pain. Patient not taking: Reported on 01/28/2014 10/10/13   Mercedes Camprubi-Soms, PA-C   BP 231/120 mmHg  Pulse 111  Resp 18  Ht 5' (1.524 m)  Wt 95 lb 14.4 oz (43.5 kg)  BMI 18.73 kg/m2  SpO2 97% Physical Exam  Constitutional: She appears distressed.  HENT:  Head: Atraumatic.  Cardiovascular: Normal rate.   Pulmonary/Chest: Effort normal. No stridor.  Abdominal: Soft. She exhibits no distension. There is no tenderness.  Musculoskeletal: Normal range of motion.  Neurological:  GCS 12, R facial droop, R UE>LUE weakness   Skin: Skin is warm. No rash noted.  Psychiatric:  Unable to assess     ED Course  Procedures (including critical care time) Labs Review Labs Reviewed  APTT - Abnormal; Notable for  the following:    aPTT 20 (*)    All other components within normal limits  CBC - Abnormal; Notable for the following:    WBC 13.2 (*)    All other components within normal limits  DIFFERENTIAL - Abnormal; Notable for the following:    Lymphs Abs 5.5 (*)    Monocytes Absolute 1.1 (*)    All other components within normal limits  COMPREHENSIVE METABOLIC PANEL - Abnormal; Notable for the following:    Glucose, Bld 126 (*)    Total Protein 8.4 (*)    All other components within normal limits  I-STAT CHEM 8, ED - Abnormal; Notable for the following:    BUN 22 (*)    Glucose, Bld 128 (*)    Hemoglobin 15.6 (*)    All other components within normal limits  I-STAT TROPOININ, ED - Abnormal; Notable for the following:    Troponin i, poc 0.17 (*)    All other components within normal limits  CBG MONITORING, ED - Abnormal; Notable for the following:    Glucose-Capillary 126 (*)    All other components within normal limits  ETHANOL  PROTIME-INR  URINE RAPID DRUG SCREEN (HOSP PERFORMED)  URINALYSIS, ROUTINE W REFLEX MICROSCOPIC    Imaging Review Ct Head Wo Contrast  07/20/2014   CLINICAL DATA:  Right-sided weakness with facial droop  EXAM: CT HEAD WITHOUT CONTRAST  TECHNIQUE: Contiguous axial images were obtained from the base of the skull through the vertex without intravenous contrast.  COMPARISON:  01/28/2014  FINDINGS: Bony calvarium is intact. No findings to suggest acute hemorrhage, acute infarction or space-occupying mass lesion are noted. Mild atrophic changes and chronic white matter ischemic change are seen. Old lacune or infarct is noted in the anterior aspect of the left thalamus.  IMPRESSION: Chronic white matter ischemic change and atrophy, stable from the prior exam.  These results were called by telephone at the time of interpretation on 07/20/2014 at 1:35 pm to Dr. Leroy KennedyAMILO, who verbally acknowledged these results.   Electronically Signed   By: Alcide CleverMark  Lukens M.D.   On: 07/20/2014  13:35     EKG Interpretation None      MDM  81 year old female comes with right-sided facial droop and right facial weakness. Code stroke was called. Neurology present on patient arrival. Patient was some residual right-sided deficits on arrival. Code stroke imaging obtained. No acute changes or bleeds noted. Patient was hypertensive 200+ systolic was given labetalol. Basic labs also obtained as part of workup which were grossly unremarkable. Patient will be admitted to the hospitalist service with neurology consult for full stroke workup including MRI etc.  Final diagnoses:  Stroke        Bridgett Larssonhris Satcha Storlie, MD 07/20/14 1450  Pricilla LovelessScott Goldston, MD 07/22/14 [phone]

## 2014-07-20 NOTE — ED Notes (Signed)
Admit Doctor at bedside.  

## 2014-07-20 NOTE — ED Notes (Signed)
Paged and spoke with Neurology regarding aspirin.  Changed order route and dose.

## 2014-07-20 NOTE — ED Notes (Signed)
EKG completed 1331 given to EDP

## 2014-07-20 NOTE — ED Notes (Signed)
EMS called from nursing home witnessed at 1230 right side facial droop and nonverbal. EMS reported flaccid on right upper and right lower extremity non verbal. CBG 140.

## 2014-07-21 ENCOUNTER — Inpatient Hospital Stay (INDEPENDENT_AMBULATORY_CARE_PROVIDER_SITE_OTHER): Payer: Medicare Other

## 2014-07-21 ENCOUNTER — Inpatient Hospital Stay (HOSPITAL_COMMUNITY): Payer: Medicare Other

## 2014-07-21 DIAGNOSIS — Z515 Encounter for palliative care: Secondary | ICD-10-CM

## 2014-07-21 DIAGNOSIS — I482 Chronic atrial fibrillation: Secondary | ICD-10-CM

## 2014-07-21 DIAGNOSIS — I639 Cerebral infarction, unspecified: Secondary | ICD-10-CM | POA: Insufficient documentation

## 2014-07-21 DIAGNOSIS — E440 Moderate protein-calorie malnutrition: Secondary | ICD-10-CM | POA: Insufficient documentation

## 2014-07-21 DIAGNOSIS — F0390 Unspecified dementia without behavioral disturbance: Secondary | ICD-10-CM

## 2014-07-21 DIAGNOSIS — Z950 Presence of cardiac pacemaker: Secondary | ICD-10-CM

## 2014-07-21 DIAGNOSIS — I6350 Cerebral infarction due to unspecified occlusion or stenosis of unspecified cerebral artery: Secondary | ICD-10-CM

## 2014-07-21 DIAGNOSIS — I10 Essential (primary) hypertension: Secondary | ICD-10-CM

## 2014-07-21 DIAGNOSIS — I6789 Other cerebrovascular disease: Secondary | ICD-10-CM

## 2014-07-21 LAB — TROPONIN I
Troponin I: 0.12 ng/mL — ABNORMAL HIGH (ref <0.031)
Troponin I: 0.15 ng/mL — ABNORMAL HIGH (ref <0.031)

## 2014-07-21 LAB — LIPID PANEL
Cholesterol: 164 mg/dL (ref 0–200)
HDL: 47 mg/dL (ref >40)
LDL CALC: 108 mg/dL — AB (ref 0–99)
Total CHOL/HDL Ratio: 3.5 RATIO
Triglycerides: 45 mg/dL (ref <150)
VLDL: 9 mg/dL (ref 0–40)

## 2014-07-21 MED ORDER — MORPHINE SULFATE 2 MG/ML IJ SOLN
2.0000 mg | INTRAMUSCULAR | Status: AC
  Administered 2014-07-21: 2 mg via INTRAVENOUS
  Filled 2014-07-21: qty 1

## 2014-07-21 MED ORDER — ENOXAPARIN SODIUM 30 MG/0.3ML ~~LOC~~ SOLN
20.0000 mg | SUBCUTANEOUS | Status: DC
  Filled 2014-07-21: qty 0.2

## 2014-07-21 MED ORDER — GLYCOPYRROLATE 0.2 MG/ML IJ SOLN
0.1000 mg | INTRAMUSCULAR | Status: DC | PRN
  Filled 2014-07-21: qty 0.5

## 2014-07-21 MED ORDER — CETYLPYRIDINIUM CHLORIDE 0.05 % MT LIQD
7.0000 mL | Freq: Two times a day (BID) | OROMUCOSAL | Status: DC
  Administered 2014-07-21 – 2014-07-22 (×3): 7 mL via OROMUCOSAL

## 2014-07-21 MED ORDER — CHLORHEXIDINE GLUCONATE 0.12 % MT SOLN
15.0000 mL | Freq: Two times a day (BID) | OROMUCOSAL | Status: DC
  Administered 2014-07-21 – 2014-07-22 (×3): 15 mL via OROMUCOSAL
  Filled 2014-07-21: qty 15

## 2014-07-21 MED ORDER — BISACODYL 10 MG RE SUPP: 10.0000 mg | Freq: Every day | RECTAL | Status: DC | PRN

## 2014-07-21 MED ORDER — SODIUM CHLORIDE 0.9 % IV SOLN
2.0000 mg/h | INTRAVENOUS | Status: DC
  Administered 2014-07-21: 2 mg/h via INTRAVENOUS
  Filled 2014-07-21: qty 10

## 2014-07-21 MED ORDER — CHLORHEXIDINE GLUCONATE 0.12 % MT SOLN
OROMUCOSAL | Status: AC
  Filled 2014-07-21: qty 15

## 2014-07-21 MED ORDER — ENOXAPARIN SODIUM 30 MG/0.3ML ~~LOC~~ SOLN: 30.0000 mg | SUBCUTANEOUS | Status: DC

## 2014-07-21 MED ORDER — ONDANSETRON HCL 4 MG/2ML IJ SOLN: 2.0000 mg | Freq: Three times a day (TID) | INTRAMUSCULAR | Status: DC | PRN

## 2014-07-21 MED ORDER — CETYLPYRIDINIUM CHLORIDE 0.05 % MT LIQD: 7.0000 mL | Freq: Four times a day (QID) | OROMUCOSAL | Status: DC

## 2014-07-21 MED ORDER — ARTIFICIAL TEARS OP OINT
TOPICAL_OINTMENT | OPHTHALMIC | Status: DC | PRN
  Filled 2014-07-21: qty 3.5

## 2014-07-21 MED ORDER — LORAZEPAM 2 MG/ML IJ SOLN: 1.0000 mg | INTRAMUSCULAR | Status: DC | PRN

## 2014-07-21 NOTE — Progress Notes (Signed)
PT Cancellation Note  Patient Details Name: Helayne SeminoleMadeline M Ransford MRN: [id number] DOB: 12/16/1911   Cancelled Treatment:    Reason Eval/Treat Not Completed: Patient not medically ready; elevated troponins. Will follow.   Fabio AsaWerner, Xaniyah Buchholz J 07/21/2014, 8:37 AM Charlotte Crumbevon Raheem Kolbe, PT DPT  [phone]

## 2014-07-21 NOTE — Consult Note (Signed)
Consultation Note Date: 07/21/2014   Patient Name: Regina Burch  DOB: 02/14/1912  MRN: [id number]  Age / Sex: [age] y.o., female   PCP: Caffie DammeKarla Smith, MD Referring Physician: Alba CoryBelkys A Regalado, MD  Reason for Consultation: Inpatient hospice referral and Terminal care  Palliative Care Assessment and Plan Summary of Established Goals of Care and Medical Treatment Preferences   Regina Burch is a [age] y.o. woman with a past medical history that is relevant for HTN, hyperlipidemia, atrial fibrillation not taking anticoagulant, s/p pacemaker placement, ischemic stroke without residual deficits, and dementia-admitted with Dominant large left MCA and left PCA infarct. Goals established as comfort and initiation of terminal care has already begun. PMT consulted for assistance with disposition options and symptom management recommendations- poorly controlled symptoms- just prior time of consultation this evening a morphine infusion was started by attending floor coverage. Infusion started at 2mg  an hour, was given a one time bolus of 2 mg, but no PRN bolus dosing ordered or titration parameters. She is very comfortable currently, unable to be aroused and deeply sedated.  Contacts Primary Decision Maker: Grandson   HCPOA: yes  Daughter also involved in decision maling -she is an [employer]orN  Code Status/Advance Care Planning:  DNR  Symptom Management:   Morphine infusion started at 2mg /hr per floor coverage for distress-family asking for comfort medications- I adjusted the infusion order -maintain current rate of 2mg /hr- she appears to be handling this well-will most likely not need to titrate this amount-I added on bolus via infusion doing for any breakthrough pain or dyspnea  Ativan PRN for any seizure activity  Robinul for secretions  Decreased IV fluid rate to KVO-enough to carry the infusion  Discontinued all cardiac meds any orders not related to comfort  Palliative Prophylaxis: Tylenol and  Dulcolax- supp, lacrilube ointment  Psycho-social/Spiritual:   Support System: Supportive family but they are not at bedside this evening- they have already consented to comfort care and I have confirmed this with PM RN.  Desire for further Chaplaincy support:no  Prognosis: < 2 weeks  Discharge Planning:  Hospice facility- I have not discussed this with family yet but this is my recommendation- I will contact them in AM -recommend CM/SW team begin that conversation-given my recommendation and prognosis- I suspect that she will slowly decline in setting of her stroke over the next few hours to days.       Chief Complaint/History of Present Illness: Acute Stroke, advanced age and illness at baseline  Primary Diagnoses  Present on Admission:  . Acute ischemic stroke . Cardiac pacemaker in situ . Dementia . Hypertensive urgency  Palliative Review of Systems: Unable to obtain from patient I have reviewed the medical record, interviewed the patient and family, and examined the patient. The following aspects are pertinent.  Past Medical History  Diagnosis Date  . Stroke   . A-fib   . Hypertension   . Aortic regurgitation   . Carotid stenosis   . Degenerative joint disease   . Hyperlipemia   . Senile dementia    History   Social History  . Marital Status: Widowed    Spouse Name: N/A  . Number of Children: N/A  . Years of Education: N/A   Social History Main Topics  . Smoking status: Never Smoker   . Smokeless tobacco: Not on file  . Alcohol Use: No  . Drug Use: No  . Sexual Activity: Not on file   Other Topics Concern  . None   Social  History Narrative   Family History  Problem Relation Age of Onset  . Stroke Mother    Scheduled Meds: . antiseptic oral rinse  7 mL Mouth Rinse q12n4p  . chlorhexidine  15 mL Mouth Rinse BID   Continuous Infusions: . morphine 2 mg/hr (07/21/14 2228)   PRN Meds:.[DISCONTINUED] acetaminophen **OR** acetaminophen, artificial  tears, bisacodyl, glycopyrrolate, LORazepam, ondansetron Medications Prior to Admission:  Prior to Admission medications   Medication Sig Start Date End Date Taking? Authorizing Provider  acetaminophen (TYLENOL) 325 MG tablet Take 650 mg by mouth every 6 (six) hours as needed for mild pain or moderate pain.   Yes Historical Provider, MD  aspirin 81 MG chewable tablet Chew 81 mg by mouth daily.   Yes Historical Provider, MD  Cranberry-Vitamin C-Probiotic (AZO CRANBERRY PO) Take 1 tablet by mouth 2 (two) times daily.   Yes Historical Provider, MD  digoxin (LANOXIN) 0.125 MG tablet Take 0.125 mg by mouth every Monday, Wednesday, and Friday.   Yes Historical Provider, MD  diltiazem (CARDIZEM CD) 240 MG 24 hr capsule Take 240 mg by mouth every other day. Alternates with diltiazem  24h capsule   Yes Historical Provider, MD  diltiazem (DILACOR XR) 120 MG 24 hr capsule Take 120 mg by mouth every other day. Alternated with diltiazem 24 hr 240 mg capsule   Yes Historical Provider, MD  donepezil (ARICEPT) 5 MG tablet Take 5 mg by mouth at bedtime.   Yes Historical Provider, MD  feeding supplement (ENSURE IMMUNE HEALTH) LIQD Take 237 mLs by mouth daily.   Yes Historical Provider, MD  lisinopril (PRINIVIL,ZESTRIL) 10 MG tablet Take 10 mg by mouth daily.   Yes Historical Provider, MD  metoprolol tartrate (LOPRESSOR) 25 MG tablet Take 12.5 mg by mouth 2 (two) times daily.   Yes Historical Provider, MD  Neomycin-Bacitracin-Polymyxin (TRIPLE ANTIBIOTIC) [phone] OINT Apply 1 application topically 2 (two) times daily as needed (for dryness).   Yes Historical Provider, MD  ibuprofen (ADVIL,MOTRIN) 400 MG tablet Take 0.5 tablets (200 mg total) by mouth every 12 (twelve) hours as needed for mild pain. Patient not taking: Reported on 01/28/2014 10/10/13   Mercedes Camprubi-Soms, PA-C   No Known Allergies CBC:    Component Value Date/Time   WBC 13.2* 07/20/2014 1317   HGB 15.6* 07/20/2014 1324   HCT 46.0  07/20/2014 1324   PLT 317 07/20/2014 1317   MCV 90.0 07/20/2014 1317   NEUTROABS 6.2 07/20/2014 1317   LYMPHSABS 5.5* 07/20/2014 1317   MONOABS 1.1* 07/20/2014 1317   EOSABS 0.3 07/20/2014 1317   BASOSABS 0.1 07/20/2014 1317   Comprehensive Metabolic Panel:    Component Value Date/Time   NA 140 07/20/2014 1324   K 4.2 07/20/2014 1324   CL 105 07/20/2014 1324   CO2 23 07/20/2014 1317   BUN 22* 07/20/2014 1324   CREATININE 0.70 07/20/2014 1324   GLUCOSE 128* 07/20/2014 1324   CALCIUM 9.3 07/20/2014 1317   AST 29 07/20/2014 1317   ALT 18 07/20/2014 1317   ALKPHOS 97 07/20/2014 1317   BILITOT 0.5 07/20/2014 1317   PROT 8.4* 07/20/2014 1317   ALBUMIN 3.7 07/20/2014 1317    Physical Exam: Vital Signs: BP 194/64 mmHg  Pulse 111  Temp(Src) 98.2 F (36.8 C) (Axillary)  Resp 27  Ht 5' (1.524 m)  Wt 43.5 kg (95 lb 14.4 oz)  BMI 18.73 kg/m2  SpO2 98% SpO2: SpO2: 98 % O2 Device: O2 Device: Not Delivered O2 Flow Rate:   Intake/output summary:  Intake/Output Summary (Last 24 hours) at 07/21/14 2358 Last data filed at 07/21/14 1100  Gross per 24 hour  Intake      0 ml  Output      0 ml  Net      0 ml   LBM:   Baseline Weight: Weight: 43.5 kg (95 lb 14.4 oz) Most recent weight: Weight: 43.5 kg (95 lb 14.4 oz)  Exam Findings:  Pale, in NAD, unresponsive and deeply sedated- pupils small, mouth clear and dry Lungs clear IRIR-tachy ABD soft and NT Flacid right side          Palliative Performance Scale: 10%            Additional Data Reviewed: Recent Labs     07/20/14  1317  07/20/14  1324  WBC  13.2*   --   HGB  14.1  15.6*  PLT  317   --   NA  140  140  BUN  16  22*  CREATININE  0.79  0.70     Time In: 11:25PM Time Out: 12:15 PM Time Total: 50 minutes  Greater than 50%  of this time was spent counseling and coordinating care related to the above assessment and plan.  Signed by: Hilbert OdorGOLDING,Amarea Macdowell, DO  Kyndahl Jablon L Valeria Krisko, DO  07/21/2014, 11:58 PM    Please contact Palliative Medicine Team phone at [phone] for questions and concerns.

## 2014-07-21 NOTE — Progress Notes (Signed)
   07/21/14 0034  Vitals  Temp 98.4 F (36.9 C)  Temp Source Axillary  BP (!) 180/92 mmHg  BP Location Right Arm  BP Method Automatic  Patient Position (if appropriate) Lying  Pulse Rate (!) 115  Pulse Rate Source Dinamap  Resp (!) 27  Oxygen Therapy  SpO2 96 %  O2 Device Room Air   Patient vomitted once while RN was taking vitals. Suctioned and sat upright. Right arm seems to be posturing. BP elevated throughout the evening regardless of medications give. MD paged. Holliday Sheaffer, [place] ScrapeSarah E, RN

## 2014-07-21 NOTE — Progress Notes (Signed)
OT Cancellation Note  Patient Details Name: Regina Burch MRN: [id number] DOB: 01/27/1912   Cancelled Treatment:    Reason Eval/Treat Not Completed: Medical issues which prohibited therapy (Troponin levels elevated- hold until appropriate)  Harolyn RutherfordJones, Anushka Hartinger B  Pager: [phone]: [phone]  07/21/2014, 7:24 AM

## 2014-07-21 NOTE — Progress Notes (Signed)
Patient's family member requesting a pain medication or something for comfort for the patient. Pt's RR is in the 20's and she is restless but opening her eyes more frequently and following my face to the left. MD paged and will continue to monitor. Adryan Shin, [place] ScrapeSarah E, RN

## 2014-07-21 NOTE — Progress Notes (Deleted)
Initial Nutrition Assessment  DOCUMENTATION CODES:  Non-severe (moderate) malnutrition in context of chronic illness  INTERVENTION:  Ensure Enlive (each supplement provides 350kcal and 20 grams of protein)  NUTRITION DIAGNOSIS:  Malnutrition related to chronic illness as evidenced by moderate depletion of body fat, moderate depletions of muscle mass.  GOAL:  Patient will meet greater than or equal to 90% of their needs  MONITOR:  Supplement acceptance, Diet advancement, Labs, Weight trends, I & O's  REASON FOR ASSESSMENT:  Low Braden    ASSESSMENT: Pt is a 81 year old female with a history of hypertension, chronic atrial fibrillation with PPM, carotid stenosis, hyperlipidemia, previous stroke without residual deficits, and dementia brought in to the hospital from her assisted living facility due to acute stroke.  Pt asleep at time of visit, history obtained from grandson at bedside. Per grandson, she has not been herself since the onset of symptoms, was not able to speak with her since. Pt has been sleeping a lot since admission. Prior to stroke pt was eating well and doing great considering her age. Pt was getting Ensure while at her nursing facility, will order BID once diet is advanced. Lucila MaineGrandson states she has always been small person and does not believe she lost weight recently. Will continue to monitor. Labs reviewed: Glu 128, BUN 22  Height:  Ht Readings from Last 1 Encounters:  07/20/14 5' (1.524 m)    Weight:  Wt Readings from Last 1 Encounters:  07/20/14 95 lb 14.4 oz (43.5 kg)    Ideal Body Weight:  45.45 kg  Wt Readings from Last 10 Encounters:  07/20/14 95 lb 14.4 oz (43.5 kg)  12/10/12 96 lb 5.5 oz (43.7 kg)  08/02/12 108 lb 11 oz (49.3 kg)    BMI:  Body mass index is 18.73 kg/(m^2).  Estimated Nutritional Needs:  Kcal:  1200 - 1400  Protein:  60 - 70 g  Fluid:  >1.4 L  Skin:  Reviewed, no issues (ecchymosis)  Diet Order:  Diet NPO time  specified  EDUCATION NEEDS:  Education needs no appropriate at this time   Intake/Output Summary (Last 24 hours) at 07/21/14 1302 Last data filed at 07/21/14 1100  Gross per 24 hour  Intake      0 ml  Output      0 ml  Net      0 ml    Last BM:  PTA  Rasma A. Smith Dietetic Intern Pager: [phone]  Agree with intern assessment. Note and chart reviewed.   Ian Malkineanne Barnett RD, LDN Inpatient Clinical Dietitian Pager: [phone] After Hours Pager: [phone]  07/21/2014 1:02 PM

## 2014-07-21 NOTE — Progress Notes (Signed)
[facility] PROGRESS NOTE  Regina Burch MWN:[id number] DOB: 05/16/1911 DOA: 07/20/2014 PCP: Caffie DammeSMITH, KARLA, MD  Assessment/Plan: 81 year old female with a history of hypertension, chronic atrial fibrillation with PPM, carotid stenosis, hyperlipidemia, previous stroke without residual deficits, and dementia brought in to the hospital from her assisted living facility due to acute onset of decreased responsiveness when she was eating lunch. The patient was noted to have right facial droop with right hemiparesis when she was eating her lunch. She then became unable to talk. EMS was activated. The stroke was activated and the patient was seen by neurology. Time last known well was 12pm, but per the family's wishes, no TPA was given. At the time of my evaluation, the patient intimately opens her eyes, but is unable to speak. All of this history is obtained from review of the medical record is speaking with the patient's grandson and the ED physician. Prior to today's events, the patient had in her usual state of health without any recent chest pain, shortness breath, vomiting, diarrhea, headaches.  In the emergency department, CT of the brain was negative for any acute findings. The patient was afebrile and hemodynamically stable. In fact, the patient was very hypertensive with a blood pressure up to 226/129. EKG showed age for ablation with nonspecific ST changes. Point-of-care troponin was 0.17. INR was 1.02  CT showed large left Middle cerebral artery and left MCA stroke. Discussion with family main goal of care is comfort measure.   1-Acute ischemic stroke -Unable to perform MRI secondary to PPM -Repeated CT head showed Acute large LEFT middle cerebral artery territory infarct, dense LEFT MCA. Acute large LEFT posterior cerebral artery territory infarct, dense basilar tip suspicious for thrombosis. -Neurology is following patient -poor prognosis for recovery. Patient aphasic, cheyne stock  respiration.  -discussed with grandson, patient would want comfort care in a situation like this. No artificial nutrition.  -Palliative care consulted for symptoms management and residential hospice evaluation.  -Will discontinue aspirin per rectum, neuro check, no more labs or test.  -discontinue Lovenox.   Acute encephalopathy -Due to the patient's large territory stroke  Chronic atrial fibrillation -Patient was not on anticoagulation due to comorbidities, advanced age, and high risk of falls -CHADS-VASc=5  Hypertension -Allow permissive hypertension in the first 24 hours -Start IV Lopressor primarily for rate control of atrial fibrillation-hold for SBP<160 -Hold lisinopril and diltiazem for the first 24 hours  Dementia -Previously stable prior to this admission  Elevated point-of-care troponin  -in setting stroke, A fib.  -no further evaluation. Patient is comfort care.   Code Status: DNR Family Communication: Care discussed with grandson, he notice that she has been more lethargic. Patient with large stroke, poor prognosis. Discussed with grandson, Mrs Regina Burch would want comfort measure in a situation like this.  Disposition Plan: Remain inpatient. Palliative care. Need residential hospice.    Consultants:  Neurology  Palliative  Procedures:  none  Antibiotics:  none  HPI/Subjective: Lethargic, aphasic, Cheyne stoke breathing.    Objective: Filed Vitals:   07/21/14 0409  BP: 202/92  Pulse: 108  Temp: 97.8 F (36.6 C)  Resp: 24   No intake or output data in the 24 hours ending 07/21/14 1022 Filed Weights   07/20/14 1356  Weight: 43.5 kg (95 lb 14.4 oz)    Exam:   General:  Lethargic, aphasic, cheyne  stoke breathing.   Cardiovascular: S 1, S 2 RRR  Respiratory: Crackles bases.   Abdomen: bs present, soft, nt  Musculoskeletal: no  edema  Data Reviewed: Basic Metabolic Panel:  Recent Labs Lab 07/20/14 1317 07/20/14 1324  NA 140 140   K 4.3 4.2  CL 107 105  CO2 23  --   GLUCOSE 126* 128*  BUN 16 22*  CREATININE 0.79 0.70  CALCIUM 9.3  --    Liver Function Tests:  Recent Labs Lab 07/20/14 1317  AST 29  ALT 18  ALKPHOS 97  BILITOT 0.5  PROT 8.4*  ALBUMIN 3.7   No results for input(s): LIPASE, AMYLASE in the last 168 hours. No results for input(s): AMMONIA in the last 168 hours. CBC:  Recent Labs Lab 07/20/14 1317 07/20/14 1324  WBC 13.2*  --   NEUTROABS 6.2  --   HGB 14.1 15.6*  HCT 41.6 46.0  MCV 90.0  --   PLT 317  --    Cardiac Enzymes:  Recent Labs Lab 07/20/14 1907 07/21/14 0020 07/21/14 0612  TROPONINI 0.13* 0.12* 0.15*   BNP (last 3 results) No results for input(s): BNP in the last 8760 hours.  ProBNP (last 3 results) No results for input(s): PROBNP in the last 8760 hours.  CBG:  Recent Labs Lab 07/20/14 1335 07/20/14 1558  GLUCAP 126* 182*    No results found for this or any previous visit (from the past 240 hour(s)).   Studies: Ct Head Wo Contrast  07/21/2014   CLINICAL DATA:  Unresponsive, stroke with cerebral ischemia. History of atrial fibrillation, carotid stenosis, pacemaker.  EXAM: CT HEAD WITHOUT CONTRAST  TECHNIQUE: Contiguous axial images were obtained from the base of the skull through the vertex without intravenous contrast.  COMPARISON:  CT of the head Jul 20, 2014  FINDINGS: Blurring of the LEFT frontal, temporal, parietal and occipital gray-white matter junction. Patchy hypodensity and LEFT basal ganglia, new. Associated mild sulcal effacement without midline shift. Moderate ventriculomegaly, slight mass effect on the LEFT occipital horn without entrapment. Moderate white matter changes exclusive of the aforementioned acute ischemia consistent with chronic small vessel ischemic disease. No intraparenchymal hemorrhage.  No abnormal extra-axial fluid collections. Basal cisterns are patent. Dense basilar tip and, dense LEFT MCA. Moderate to severe calcific  atherosclerosis the carotid siphons and included vertebral arteries.  No skull fracture. The included ocular globes and orbital contents are non-suspicious. Bilateral ocular lens implants. The mastoid aircells and included paranasal sinuses are well-aerated.  IMPRESSION: Acute large LEFT middle cerebral artery territory infarct, dense LEFT MCA.  Acute large LEFT posterior cerebral artery territory infarct, dense basilar tip suspicious for thrombosis.   Electronically Signed   By: Awilda Metroourtnay  Bloomer   On: 07/21/2014 02:20   Ct Head Wo Contrast  07/20/2014   CLINICAL DATA:  Right-sided weakness with facial droop  EXAM: CT HEAD WITHOUT CONTRAST  TECHNIQUE: Contiguous axial images were obtained from the base of the skull through the vertex without intravenous contrast.  COMPARISON:  01/28/2014  FINDINGS: Bony calvarium is intact. No findings to suggest acute hemorrhage, acute infarction or space-occupying mass lesion are noted. Mild atrophic changes and chronic white matter ischemic change are seen. Old lacune or infarct is noted in the anterior aspect of the left thalamus.  IMPRESSION: Chronic white matter ischemic change and atrophy, stable from the prior exam.  These results were called by telephone at the time of interpretation on 07/20/2014 at 1:35 pm to Dr. Leroy KennedyAMILO, who verbally acknowledged these results.   Electronically Signed   By: Alcide CleverMark  Lukens M.D.   On: 07/20/2014 13:35   [facility]  07/[facility]2014   CLINICAL DATA:  [age] old female. History of dyspnea and hypertension with stroke.  EXAM: PORTABLE CHEST - 1 VIEW  COMPARISON:  05/25/2013, 12/07/2012  FINDINGS: Cardiomediastinal silhouette unchanged in size and contour. Right rotation the patient somewhat limits evaluation.  Calcifications of the aortic arch.  Unchanged position of left chest wall cardiac pacing device with 2 leads in place.  No pleural effusion or pneumothorax.  No confluent airspace disease.  Chronic interstitial  opacities.  Nodule at the left base unchanged, likely calcified granuloma.  Calcifications of the carotid arteries.  No displaced fracture  IMPRESSION: No radiographic evidence of acute cardiopulmonary disease with a background of chronic changes.  Atherosclerosis.  Signed,  Yvone Neu. Loreta Ave, DO  [facility]  [facility]   Electronically Signed   By: Gilmer Mor D.O.   On: 07/20/2014 16:48    Scheduled Meds: . antiseptic oral rinse  7 mL Mouth Rinse q12n4p  . aspirin  300 mg Rectal Daily  . chlorhexidine  15 mL Mouth Rinse BID  . enoxaparin (LOVENOX) injection  20 mg Subcutaneous Q24H  . metoprolol  2.5 mg Intravenous 4 times per day   Continuous Infusions:   Active Problems:   Hypertensive urgency   Cardiac pacemaker in situ   Dementia   Acute ischemic stroke   Chronic atrial fibrillation   Malnutrition of moderate degree    Time spent: 35 minutes.     Hartley Barefoot A  [facility] Pager [phone]. If 7PM-7AM, please contact night-coverage at [email or web address], password [facility] 07/21/2014, 10:22 AM  LOS: 1 day

## 2014-07-21 NOTE — Evaluation (Signed)
Clinical/Bedside Swallow Evaluation Patient Details  Name: Regina Burch MRN: [id number] Date of Birth: 11/20/1911  Today's Date: 07/21/2014 Time: SLP Start Time (ACUTE ONLY): 0843 SLP Stop Time (ACUTE ONLY): 0852 SLP Time Calculation (min) (ACUTE ONLY): 9 min  Past Medical History:  Past Medical History  Diagnosis Date  . Stroke   . A-fib   . Hypertension   . Aortic regurgitation   . Carotid stenosis   . Degenerative joint disease   . Hyperlipemia   . Senile dementia    Past Surgical History:  Past Surgical History  Procedure Laterality Date  . Pacemaker insertion     HPI:  81 year old female with a history of hypertension, chronic atrial fibrillation, with pacemaker, carotid stenosis, hyperlipidemia, previous stroke without residual deficits, and dementia brought in to the hospital from her assisted living facility due to acute onset of decreased responsiveness and right facial droop with right hemiparesis when she was eating her lunch. Per MD notes pt is also unable to speak. CT 5/17 revealed acute large LEFT middle cerebral artery territory infarct, dense LEFT MCA. Acute large LEFT posterior cerebral artery territory infarct, dense basilar tip suspicious for thrombosis.   Assessment / Plan / Recommendation Clinical Impression  Max tactile and verbal stimulation provided by SLP was unsuccessful to adequately arouse pt. Oral care provided to dentition and lips; unable to adequately clean tongue. Unsafe to offer po's at present. SLP will follow; recommend oral care. ? prognosis.     Aspiration Risk  Moderate    Diet Recommendation NPO        Other  Recommendations Oral Care Recommendations: Oral care BID   Follow Up Recommendations       Frequency and Duration min 1 x/week  1 week   Pertinent Vitals/Pain none         Swallow Study          Oral/Motor/Sensory Function Overall Oral Motor/Sensory Function:  (unable to fully assess; no observable deficit at  rest)   Ice Chips Ice chips: Not tested   Thin Liquid Thin Liquid: Not tested    Nectar Thick Nectar Thick Liquid: Not tested   Honey Thick Honey Thick Liquid: Not tested   Puree Puree: Not tested   Solid   GO    Solid: Not tested       Royce MacadamiaLitaker, Luellen Howson Willis 07/21/2014,9:04 AM  Breck CoonsLisa Willis Lonell FaceLitaker M.Ed [employer] Pager [phone]

## 2014-07-21 NOTE — Progress Notes (Signed)
  Echocardiogram 2D Echocardiogram has been performed.  Aris EvertsRix, Erroll Wilbourne A 07/21/2014, 10:29 AM

## 2014-07-21 NOTE — Progress Notes (Signed)
STROKE TEAM PROGRESS NOTE   HISTORY Regina Burch is an [age] y.o. female with a past medical history that is relevant for HTN, hyperlipidemia, atrial fibrillation no taking anticoagulant, s/p pacemaker placement, ischemic stroke without residual deficits, and dementia, brought in via EMS due to acute onset of right facial droop, right hemiparesis, decreased responsiveness and aphasia. Patient is from an assisted living facility, and at baseline is able to dress, eat, and transfer from her wheelchair to bed. Today, she was eating lunch when suddenly became less responsive, with right face droopiness, and started leaning to the right side, unable to talk, and therefore EMS was summoned. LKW 07/20/2014 at 1200 noon. Upon arrival to the ED patient was awake, non verbal, NIHSS. CT brain was reviewed and showed no acute abnormality. NIHSS: 25. Patient was not administered TPA secondary to family refusal. She was admitted for further evaluation and treatment.   SUBJECTIVE (INTERVAL HISTORY) Her grandson/POA is at the bedside.  Overall he feels her condition is gradually worsening. Feels she is more relaxed compared with yesterday.   OBJECTIVE Temp:  [97.7 F (36.5 C)-100.2 F (37.9 C)] 100.2 F (37.9 C) (05/17 1049) Pulse Rate:  [59-115] 102 (05/17 1049) Cardiac Rhythm:  [-] Atrial fibrillation (05/16 2023) Resp:  [16-27] 22 (05/17 1049) BP: (160-231)/(91-141) 179/96 mmHg (05/17 1049) SpO2:  [95 %-100 %] 96 % (05/17 1049) Weight:  [43.5 kg (95 lb 14.4 oz)] 43.5 kg (95 lb 14.4 oz) (05/16 1356)   Recent Labs Lab 07/20/14 1335 07/20/14 1558  GLUCAP 126* 182*    Recent Labs Lab 07/20/14 1317 07/20/14 1324  NA 140 140  K 4.3 4.2  CL 107 105  CO2 23  --   GLUCOSE 126* 128*  BUN 16 22*  CREATININE 0.79 0.70  CALCIUM 9.3  --     Recent Labs Lab 07/20/14 1317  AST 29  ALT 18  ALKPHOS 97  BILITOT 0.5  PROT 8.4*  ALBUMIN 3.7    Recent Labs Lab 07/20/14 1317 07/20/14 1324   WBC 13.2*  --   NEUTROABS 6.2  --   HGB 14.1 15.6*  HCT 41.6 46.0  MCV 90.0  --   PLT 317  --     Recent Labs Lab 07/20/14 1907 07/21/14 0020 07/21/14 0612  TROPONINI 0.13* 0.12* 0.15*    Recent Labs  07/20/14 1317  LABPROT 13.6  INR 1.02   No results for input(s): COLORURINE, LABSPEC, PHURINE, GLUCOSEU, HGBUR, BILIRUBINUR, KETONESUR, PROTEINUR, UROBILINOGEN, NITRITE, LEUKOCYTESUR in the last 72 hours.  Invalid input(s): APPERANCEUR     Component Value Date/Time   CHOL 164 07/21/2014 0020   TRIG 45 07/21/2014 0020   HDL 47 07/21/2014 0020   CHOLHDL 3.5 07/21/2014 0020   VLDL 9 07/21/2014 0020   LDLCALC 108* 07/21/2014 0020   Lab Results  Component Value Date   HGBA1C 5.7* 08/05/2012   No results found for: LABOPIA, COCAINSCRNUR, LABBENZ, AMPHETMU, THCU, LABBARB   Recent Labs Lab 07/20/14 1317  ETH <5    Ct Head Wo Contrast 07/21/2014   Acute large LEFT middle cerebral artery territory infarct, dense LEFT MCA.  Acute large LEFT posterior cerebral artery territory infarct, dense basilar tip suspicious for thrombosis.     07/20/2014     Chronic white matter ischemic change and atrophy, stable from the prior exam.    [facility]    No radiographic evidence of acute cardiopulmonary disease with a background of chronic changes.  2D echo - Compared to the prior echo in 2014, the EF is higher. There is moderate to severe aortic stenosis, moderate posterior MAC, mild AI, MR and TR, RVSP 38 mmHg, pacer wires in place.  PHYSICAL EXAM  Temp:  [97.7 F (36.5 C)-100.2 F (37.9 C)] 100.2 F (37.9 C) (05/17 1049) Pulse Rate:  [59-115] 102 (05/17 1049) Resp:  [16-27] 22 (05/17 1049) BP: (160-231)/(91-141) 179/96 mmHg (05/17 1049) SpO2:  [95 %-100 %] 96 % (05/17 1049) Weight:  [95 lb 14.4 oz (43.5 kg)] 95 lb 14.4 oz (43.5 kg) (05/16 1356)  General - thin built, well developed, eye closed and not open eyes on voice, not following  commands.  Ophthalmologic - Fundi not visualized due to incorporation.  Cardiovascular - irregularly irregular heart rate and rhythm.  Neuro - eye closed and not open eyes on voice, not following commands and non verbal. Eyes in the middle, not blinking to visual threat, PERRL. Right facial droop, tongue in middle inside mouth. Right UE flaccid, LUE has spontaneous movement but increased muscle tone. RLE 2/5 on pain stimulation and LLE has spontaneous movement at least 3/5. Babinski positive on the right. Reflex diminished throughout.  ASSESSMENT/PLAN Regina Burch is a 81 y.o. female with history of HTN, hyperlipidemia, atrial fibrillation not taking anticoagulant, s/p pacemaker placement, ischemic stroke without residual deficits, and dementia presenting with right facial droop, right hemiparesis, decreased responsiveness and aphasia. She did not receive IV t-PA due to family refusal.   Stroke:  Dominant large left MCA and left PCA infarct, embolic secondary to known atrial fibrillation not on anticoagulation  Resultant  Lethargic state, Cheyne stokes respiration, global aphasia, flaccid right hemiparesis, dysphagia  CT   Large L MCA and L PCA infact. Dense L MCA sign  LDL 108  HgbA1c pending  Lovenox 20 mg sq daily for VTE prophylaxis Diet NPO time specified  aspirin 81 mg orally every day prior to admission, now on aspirin 300 mg suppository daily.  Neuro prognosis for recovery poor. Agree with Hospice/palliative care involvement. Comfort care recommended  Therapy recommendations:  Therapy not recommended given end-of-life state  Disposition:  ? residential Hospice (baseline ALF, w/c bound, dementia)  Recommend palliative care consult and comfort care measures. Discussed with grandson, POA, and agree with the plan.  Stroke team will sign off  Atrial Fibrillation  Home meds:  Not on anticoagulation  Likely the cause of stroke  On ASA suppository  Recommend  comfort care measures.   Malignant Hypertension  BP as high as 226/129  Home meds:   Cardizem, lisinopril, metoprolol  Permissive hypertension (OK if <220/120) for 24-48 hours post stroke and then gradually normalized within 5-7 days.  Unstable  Hyperlipidemia  Home meds:  No statin  LDL 108  Hold off stain due to comfort care  Other Stroke Risk Factors  Advanced age  Cachectic, Body mass index is 18.73 kg/(m^2).   Hx stroke/TIA  Family hx stroke (mother)  Known Carotid stenosis  Other Active Problems  Baseline dementia  Hospital day # 1  BIBY,SHARON  [facility] See Amion for Pager information 07/21/2014 11:48 AM   I, the attending vascular neurologist, have personally obtained a history, examined the patient, evaluated laboratory data, individually viewed imaging studies and agree with radiology interpretations. I also discussed with grandson and Dr. Sunnie Nielsen regarding her care plan. Together with the NP/PA, we formulated the assessment and plan of care which reflects our mutual decision.  I have made any  additions or clarifications directly to the above note and agree with the findings and plan as currently documented.   [age] yo F with Afib not on AC was admitted for large left MCA and PCA stroke. POA has refused tPA. Due to poor prognosis and no meaningful neurological recovery, POA decided for comfort care. I agree with comfort care, and DNR.   Neurology will sign off. Please call with questions. Thanks for the consult.  Marvel PlanJindong Mackenna Kamer, MD PhD Stroke Neurology 07/21/2014 1:07 PM       To contact Stroke Continuity provider, please refer to [email or web address]. After hours, contact General Neurology

## 2014-07-21 NOTE — Progress Notes (Signed)
Initial Nutrition Assessment  DOCUMENTATION CODES:  Non-severe (moderate) malnutrition in context of chronic illness  INTERVENTION:  Ensure Enlive (each supplement provides 350kcal and 20 grams of protein)  NUTRITION DIAGNOSIS:  Malnutrition related to chronic illness as evidenced by moderate depletion of body fat, moderate depletions of muscle mass.  GOAL:  Patient will meet greater than or equal to 90% of their needs  MONITOR:  Supplement acceptance, Diet advancement, Labs, Weight trends, I & O's  REASON FOR ASSESSMENT:  Low Braden    ASSESSMENT: Pt is a 81 year old female with a history of hypertension, chronic atrial fibrillation with PPM, carotid stenosis, hyperlipidemia, previous stroke without residual deficits, and dementia brought in to the hospital from her assisted living facility due to acute stroke.  Pt asleep at time of visit, history obtained from grandson at bedside. Per grandson, she has not been herself since the onset of symptoms, was not able to speak with her since. Pt has been sleeping a lot since admission. Prior to stroke pt was eating well and doing great considering her age. Pt was getting Ensure while at her nursing facility, will order BID once diet is advanced. Lucila MaineGrandson states she has always been small person and does not believe she lost weight recently. Will continue to monitor. Labs reviewed: Glu 128, BUN 22  Height:  Ht Readings from Last 1 Encounters:  07/20/14 5' (1.524 m)    Weight:  Wt Readings from Last 1 Encounters:  07/20/14 95 lb 14.4 oz (43.5 kg)    Ideal Body Weight:  45.45 kg  Wt Readings from Last 10 Encounters:  07/20/14 95 lb 14.4 oz (43.5 kg)  12/10/12 96 lb 5.5 oz (43.7 kg)  08/02/12 108 lb 11 oz (49.3 kg)    BMI:  Body mass index is 18.73 kg/(m^2).  Estimated Nutritional Needs:  Kcal:  1200 - 1400  Protein:  60 - 70 g  Fluid:  >1.4 L  Skin:  Reviewed, no issues (ecchymosis)  Diet Order:  Diet NPO time  specified  EDUCATION NEEDS:  Education needs no appropriate at this time  No intake or output data in the 24 hours ending 07/21/14 1006  Last BM:  PTA  Tamari Redwine A. [facility] Dietetic Intern Pager: [phone] 07/21/2014 10:14 AM

## 2014-07-21 NOTE — Progress Notes (Signed)
PT Cancellation Note  Patient Details Name: Regina SeminoleMadeline M Tribbey MRN: [id number] DOB: 04/07/1911   Cancelled Treatment:    Reason Eval/Treat Not Completed: Patient not medically ready (per neuro, therapy not recommended at this time) "Therapy not recommended given end-of-life state" (Neuro MD) Will sign off.   Fabio AsaWerner, Chistian Kasler J 07/21/2014, 4:15 PM Charlotte Crumbevon Tanvir Hipple, PT DPT  [phone]

## 2014-07-22 LAB — HEMOGLOBIN A1C
HEMOGLOBIN A1C: 6 % — AB (ref 4.8–5.6)
Mean Plasma Glucose: 126 mg/dL

## 2014-07-22 MED ORDER — SODIUM CHLORIDE 0.9 % IV SOLN: 2.0000 mg/h | INTRAVENOUS | Status: AC

## 2014-07-22 NOTE — Clinical Social Work Note (Signed)
Clinical Social Worker facilitated patient discharge including contacting patient family and facility to confirm patient discharge plans.  Clinical information faxed to facility and family agreeable with plan.  CSW arranged ambulance [facility] via [facility].  RN to call report prior to discharge.  DC packet prepared and placed on chart for transport with number to call report.    Clinical Social Worker will sign off for now as social work intervention is no longer needed. Please consult us again if new need arises.  Derenda FennelBashira Jahmad Petrich, MSW, LCSWA [phone] 07/22/2014 1:12 PM

## 2014-07-22 NOTE — Progress Notes (Signed)
Discharge orders received.  VSS upon discharge, IV removed.  Transported out via [facility].  [facility]. Sondra ComeSilva, Coree Brame M, RN

## 2014-07-22 NOTE — Clinical Social Work Note (Signed)
Clinical Social Work Assessment  Patient Details  Name: Regina Burch MRN: [id number] Date of Birth: Dec 14, 1911  Date of referral:  07/22/14               Reason for consult:  Facility Placement, End of Life/Hospice                Permission sought to share information with:  Case Manager, Customer service manager, PCP, Family Supports Permission granted to share information::  Yes, Verbal Permission Granted  Name::      (Richard [employer])  Agency::   (YES)  Relationship::   ([place])  Contact Information:   [phone])  Housing/Transportation Living arrangements for the past 2 months:  [place] of Information:  Adult Children Patient Interpreter Needed:  None Criminal Activity/Legal Involvement Pertinent to Current Situation/Hospitalization:  No - Comment as needed Significant Relationships:  Adult Children Lives with:  Facility Resident Do you feel safe going back to the place where you live?  Yes Need for family participation in patient care:  Yes (Comment)  Care giving concerns:  No care giving concerns reported at this time.    Social Worker assessment / plan:  Holiday representative received residential hospice referral from palliative care team. CSW met with pt's grandson, Regina Burch present at bedside in reference to post-acute placement for residential hospice. CSW introduced CSW role and residential hospice referral process. Pt's grandson reported pt is from ALF, [place] and has been a resident of the facility for several years. Pt's grandson reported that patient has suffered from an acute ischemic stroke and awaiting next stages of care. CSW explained palliative care recommendations for residential hospice and pt's grandson is agreeable to placement at [place]. CSW informed pt's grandson that [employer] would plan to meet with him here at [facility] and further discuss the services  [employer] provides. Pt's grandson asked questions in regards to financial coverage and CSW advised pt's grandson to make financial inquiries with Liaison of [employer]. Pt's grandson agreeable to residential hospice referral to [facility]. CSW has made referral and will continue to follow pt and pt's family for continued support and facilitate discharge needs. CSW notified MD.   Employment status:  Retired Nurse, adult PT Recommendations:  Not assessed at this time Information / Referral to community resources:   [facility] )  Patient/Family's Response to care:  Pt lying in bed disoriented. Pt's grandson sitting closely at bedside holding pt's hand. Pt's grandson calm and accepting during assessment. Pt's grandson also pleasant and appreciated social work intervention.   Patient/Family's Understanding of and Emotional Response to Diagnosis, Current Treatment, and Prognosis:  Pt's grandson expressed understanding of > 2 prognosis and diagnosis of acute ischemic stroke. Pt's grandson accepting of hospice referral and further stated that pt has lived a good life. Pt's grandson also understands pt will transition to [facility] today, 5/18.  Emotional Assessment Appearance:  Appears stated age Attitude/Demeanor/Rapport:  Unable to Assess Affect (typically observed):  Unable to Assess Orientation:  Oriented to Self Alcohol / Substance use:  Never Used Psych involvement (Current and /or in the community):  No (Comment)  Discharge Needs  Concerns to be addressed:  No discharge needs identified Readmission within the last 30 days:  No Current discharge risk:  None Barriers to Discharge:  No Barriers Identified   Regina Burch, MSW, LCSWA [phone] 07/22/2014 11:40 AM

## 2014-07-22 NOTE — Discharge Summary (Signed)
Physician Discharge Summary  Regina SeminoleMadeline M Burch ZOX:[id number] DOB: 11/20/1911 DOA: 07/20/2014  PCP: Caffie DammeSMITH, KARLA, MD  Admit date: 07/20/2014 Discharge date: 07/22/2014  Time spent: 35 minutes  Recommendations for Outpatient Follow-up:  1. To inpatient hospice  Discharge Diagnoses:  Active Problems:   Hypertensive urgency   Cardiac pacemaker in situ   Dementia   Acute ischemic stroke   Chronic atrial fibrillation   Malnutrition of moderate degree   Stroke with cerebral ischemia   Discharge Condition: terminal  Diet recommendation:   Filed Weights   07/20/14 1356  Weight: 43.5 kg (95 lb 14.4 oz)    History of present illness:  81 year old female with a history of hypertension, chronic atrial fibrillation with PPM, carotid stenosis, hyperlipidemia, previous stroke without residual deficits, and dementia brought in to the hospital from her assisted living facility due to acute onset of decreased responsiveness when she was eating lunch. The patient was noted to have right facial droop with right hemiparesis when she was eating her lunch. She then became unable to talk. EMS was activated. The stroke was activated and the patient was seen by neurology. Time last known well was 12pm, but per the family's wishes, no TPA was given. At the time of my evaluation, the patient intimately opens her eyes, but is unable to speak. All of this history is obtained from review of the medical record is speaking with the patient's grandson and the ED physician. Prior to today's events, the patient had in her usual state of health without any recent chest pain, shortness breath, vomiting, diarrhea, headaches.  In the emergency department, CT of the brain was negative for any acute findings. The patient was afebrile and hemodynamically stable. In fact, the patient was very hypertensive with a blood pressure up to 226/129. EKG showed age for ablation with nonspecific ST changes. Point-of-care troponin was  0.17. INR was 1.02.  Hospital Course:  Acute ischemic stroke -comfort care now  Acute encephalopathy -comfort care focus  Chronic atrial fibrillation -comfort care  Hypertension -comfort care  Dementia -comfort care  Elevated point-of-care troponin  -comfort care  Procedures:    Consultations:  neuro  Discharge Exam: Filed Vitals:   07/22/14 0607  BP: 143/84  Pulse: 117  Temp: 98.2 F (36.8 C)  Resp: 27    General: A+Ox3, NAd Cardiovascular: rrr Respiratory: clear  Discharge Instructions    Current Discharge Medication List    START taking these medications   Details  morphine 250 mg in sodium chloride 0.9 % 240 mL Inject 2 mg/hr into the vein continuous.      STOP taking these medications     acetaminophen (TYLENOL) 325 MG tablet      aspirin 81 MG chewable tablet      Cranberry-Vitamin C-Probiotic (AZO CRANBERRY PO)      digoxin (LANOXIN) 0.125 MG tablet      diltiazem (CARDIZEM CD) 240 MG 24 hr capsule      diltiazem (DILACOR XR) 120 MG 24 hr capsule      donepezil (ARICEPT) 5 MG tablet      feeding supplement (ENSURE IMMUNE HEALTH) LIQD      lisinopril (PRINIVIL,ZESTRIL) 10 MG tablet      metoprolol tartrate (LOPRESSOR) 25 MG tablet      Neomycin-Bacitracin-Polymyxin (TRIPLE ANTIBIOTIC) [phone] OINT      ibuprofen (ADVIL,MOTRIN) 400 MG tablet        No Known Allergies    The results of significant diagnostics from this hospitalization (including imaging,  microbiology, ancillary and laboratory) are listed below for reference.    Significant Diagnostic Studies: Ct Head Wo Contrast  07/21/2014   CLINICAL DATA:  Unresponsive, stroke with cerebral ischemia. History of atrial fibrillation, carotid stenosis, pacemaker.  EXAM: CT HEAD WITHOUT CONTRAST  TECHNIQUE: Contiguous axial images were obtained from the base of the skull through the vertex without intravenous contrast.  COMPARISON:  CT of the head Jul 20, 2014   FINDINGS: Blurring of the LEFT frontal, temporal, parietal and occipital gray-white matter junction. Patchy hypodensity and LEFT basal ganglia, new. Associated mild sulcal effacement without midline shift. Moderate ventriculomegaly, slight mass effect on the LEFT occipital horn without entrapment. Moderate white matter changes exclusive of the aforementioned acute ischemia consistent with chronic small vessel ischemic disease. No intraparenchymal hemorrhage.  No abnormal extra-axial fluid collections. Basal cisterns are patent. Dense basilar tip and, dense LEFT MCA. Moderate to severe calcific atherosclerosis the carotid siphons and included vertebral arteries.  No skull fracture. The included ocular globes and orbital contents are non-suspicious. Bilateral ocular lens implants. The mastoid aircells and included paranasal sinuses are well-aerated.  IMPRESSION: Acute large LEFT middle cerebral artery territory infarct, dense LEFT MCA.  Acute large LEFT posterior cerebral artery territory infarct, dense basilar tip suspicious for thrombosis.   Electronically Signed   By: Awilda Metro   On: 07/21/2014 02:20   Ct Head Wo Contrast  07/20/2014   CLINICAL DATA:  Right-sided weakness with facial droop  EXAM: CT HEAD WITHOUT CONTRAST  TECHNIQUE: Contiguous axial images were obtained from the base of the skull through the vertex without intravenous contrast.  COMPARISON:  01/28/2014  FINDINGS: Bony calvarium is intact. No findings to suggest acute hemorrhage, acute infarction or space-occupying mass lesion are noted. Mild atrophic changes and chronic white matter ischemic change are seen. Old lacune or infarct is noted in the anterior aspect of the left thalamus.  IMPRESSION: Chronic white matter ischemic change and atrophy, stable from the prior exam.  These results were called by telephone at the time of interpretation on 07/20/2014 at 1:35 pm to Dr. Leroy Kennedy, who verbally acknowledged these results.   Electronically  Signed   By: Alcide Clever M.D.   On: 07/20/2014 13:35   Dg Chest Port 1 View  07/20/2014   CLINICAL DATA:  [age] old female. History of dyspnea and hypertension with stroke.  EXAM: PORTABLE CHEST - 1 VIEW  COMPARISON:  05/25/2013, 12/07/2012  FINDINGS: Cardiomediastinal silhouette unchanged in size and contour. Right rotation the patient somewhat limits evaluation.  Calcifications of the aortic arch.  Unchanged position of left chest wall cardiac pacing device with 2 leads in place.  No pleural effusion or pneumothorax.  No confluent airspace disease.  Chronic interstitial opacities.  Nodule at the left base unchanged, likely calcified granuloma.  Calcifications of the carotid arteries.  No displaced fracture  IMPRESSION: No radiographic evidence of acute cardiopulmonary disease with a background of chronic changes.  Atherosclerosis.  Signed,  Yvone Neu. Loreta Ave, DO  [facility]  [facility]   Electronically Signed   By: Gilmer Mor D.O.   On: 07/20/2014 16:48    Microbiology: No results found for this or any previous visit (from the past 240 hour(s)).   Labs: Basic Metabolic Panel:  Recent Labs Lab 07/20/14 1317 07/20/14 1324  NA 140 140  K 4.3 4.2  CL 107 105  CO2 23  --   GLUCOSE 126* 128*  BUN 16 22*  CREATININE  0.79 0.70  CALCIUM 9.3  --    Liver Function Tests:  Recent Labs Lab 07/20/14 1317  AST 29  ALT 18  ALKPHOS 97  BILITOT 0.5  PROT 8.4*  ALBUMIN 3.7   No results for input(s): LIPASE, AMYLASE in the last 168 hours. No results for input(s): AMMONIA in the last 168 hours. CBC:  Recent Labs Lab 07/20/14 1317 07/20/14 1324  WBC 13.2*  --   NEUTROABS 6.2  --   HGB 14.1 15.6*  HCT 41.6 46.0  MCV 90.0  --   PLT 317  --    Cardiac Enzymes:  Recent Labs Lab 07/20/14 1907 07/21/14 0020 07/21/14 0612  TROPONINI 0.13* 0.12* 0.15*   BNP: BNP (last 3 results) No results for input(s): BNP in the last  8760 hours.  ProBNP (last 3 results) No results for input(s): PROBNP in the last 8760 hours.  CBG:  Recent Labs Lab 07/20/14 1335 07/20/14 1558  GLUCAP 126* 182*       Signed:  VANN, JESSICA  [facility] 07/22/2014, 11:10 AM

## 2014-08-05 DEATH — deceased

## 2015-10-29 IMAGING — CR DG CHEST 1V PORT
1 series · 1 of 1 positions shown · non-contrast
Comparison: 05/25/2013, 12/07/2012

CLINICAL DATA: [REDACTED] female. History of dyspnea
and hypertension with stroke.

EXAM:
PORTABLE CHEST - 1 VIEW

[AP]
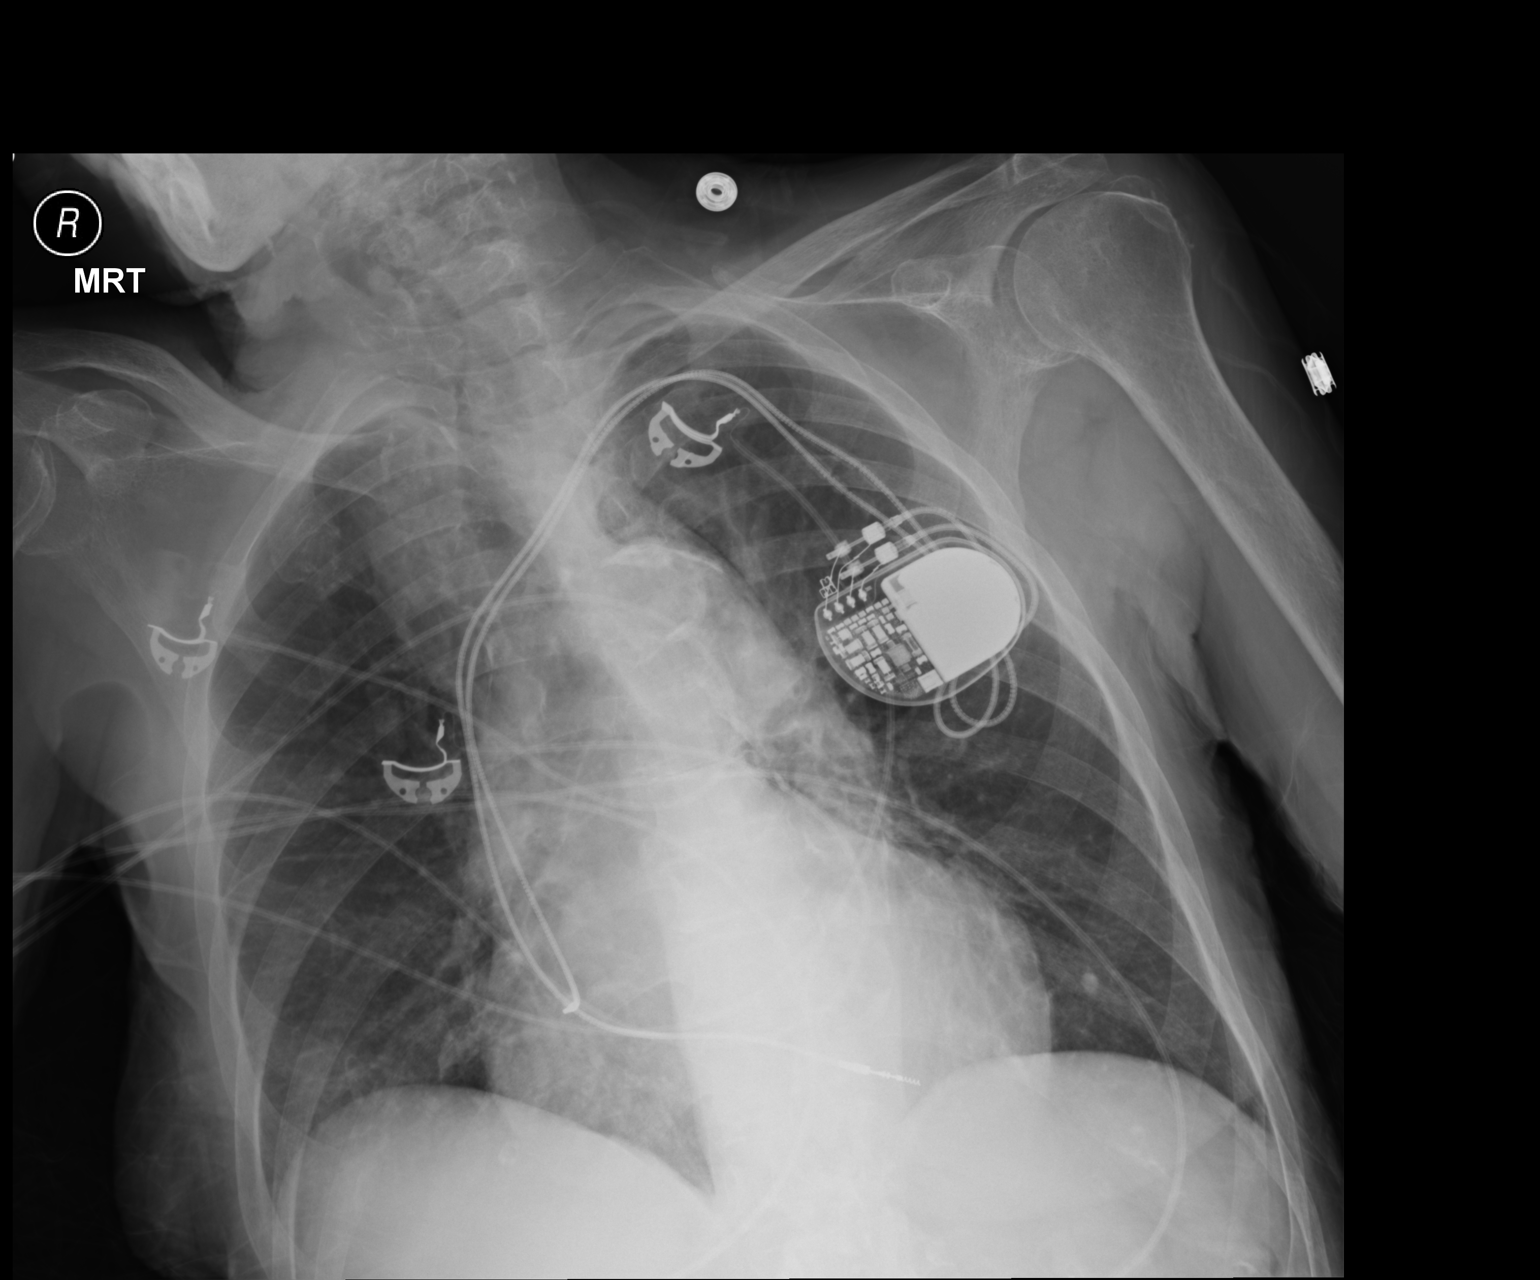

[1 of 1 positions shown; findings below may reference images not displayed]

FINDINGS: Cardiomediastinal silhouette unchanged in size and contour. Right
rotation the patient somewhat limits evaluation.

Calcifications of the aortic arch.

Unchanged position of left chest wall cardiac pacing device with 2
leads in place.

No pleural effusion or pneumothorax.

No confluent airspace disease.

Chronic interstitial opacities.

Nodule at the left base unchanged, likely calcified granuloma.

Calcifications of the carotid arteries.

No displaced fracture
IMPRESSION: No radiographic evidence of acute cardiopulmonary disease with a
background of chronic changes.

Atherosclerosis.

## 2015-10-30 IMAGING — CT CT HEAD W/O CM
1 of 2 series · 15 of 30 positions shown, 19 images · non-contrast
Comparison: CT of the head July 20, 2014

CLINICAL DATA: Unresponsive, stroke with cerebral ischemia. History
of atrial fibrillation, carotid stenosis, pacemaker.

EXAM:
CT HEAD WITHOUT CONTRAST
TECHNIQUE: Contiguous axial images were obtained from the base of the skull
through the vertex without intravenous contrast.

[Series 3: head 2.0 h70h · axial · 0.44mm/px · z∈[-115,+19]mm · 15 of 75 slices shown, 19 images]
[im 4/75  brain]
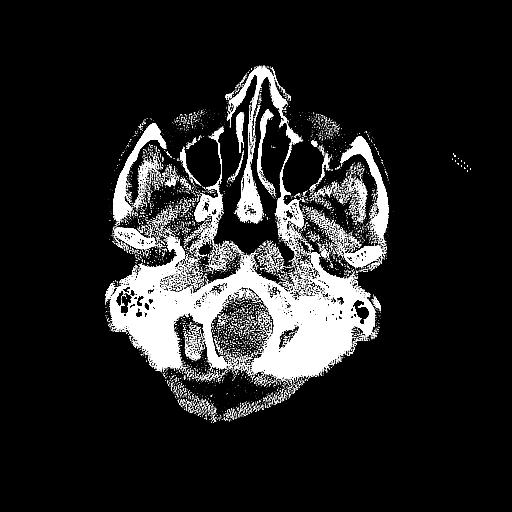
[im 4/75  bone]
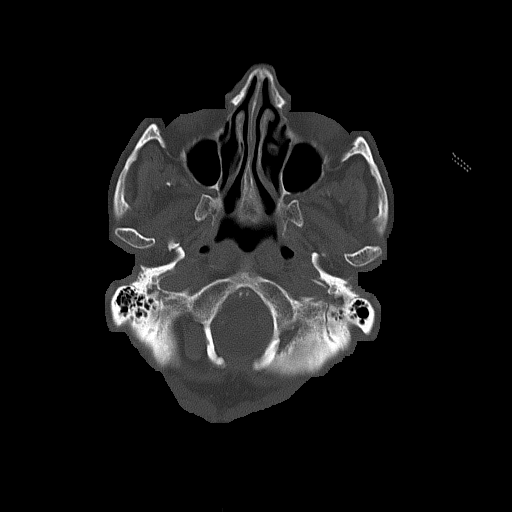
[im 8/75  brain]
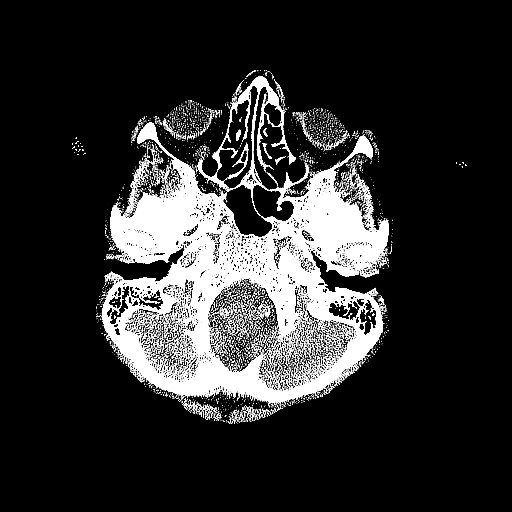
[im 15/75  brain]
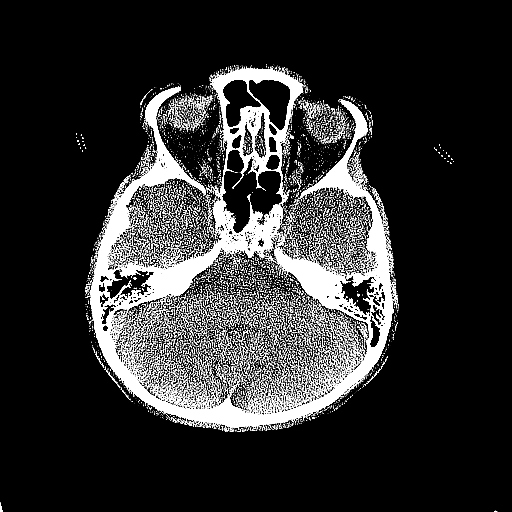
[im 19/75  brain]
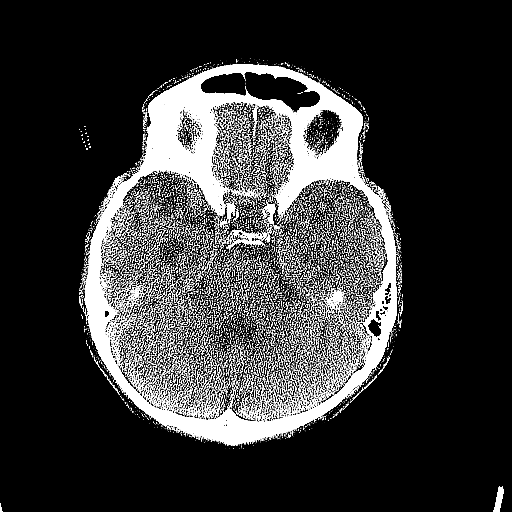
[im 23/75  brain]
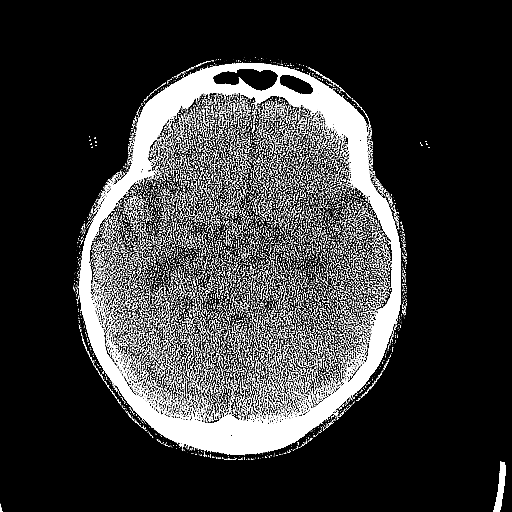
[im 23/75  bone]
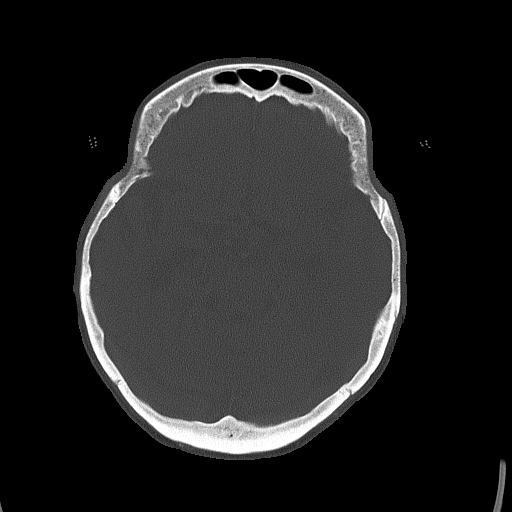
[im 26/75  brain]
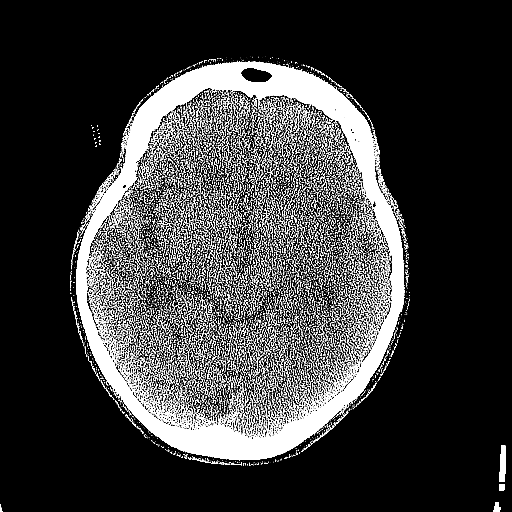
[im 34/75  brain]
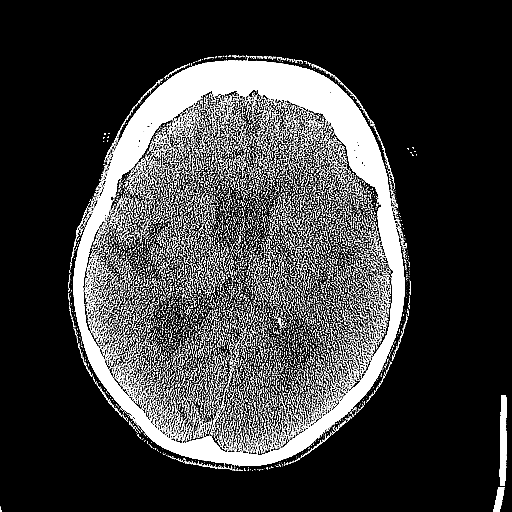
[im 38/75  brain]
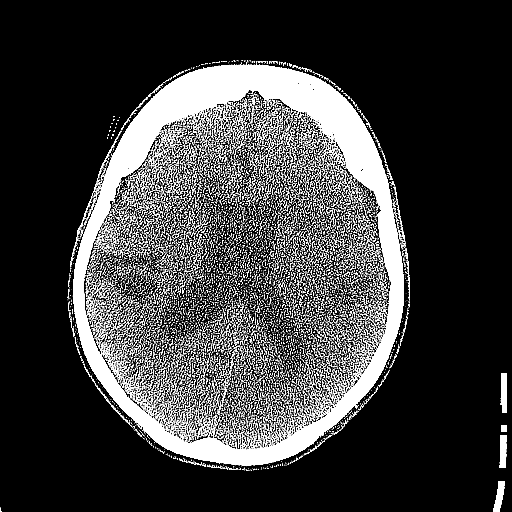
[im 41/75  brain]
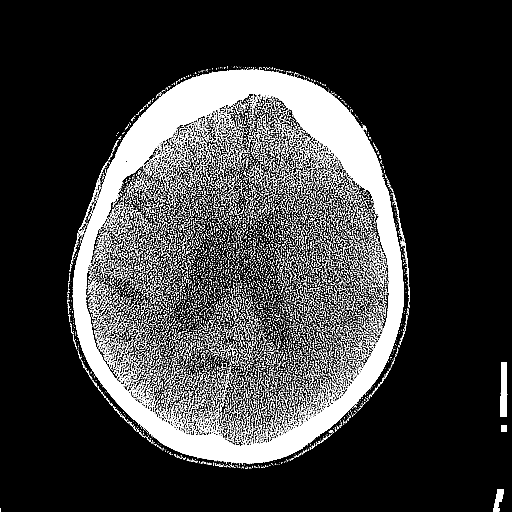
[im 41/75  bone]
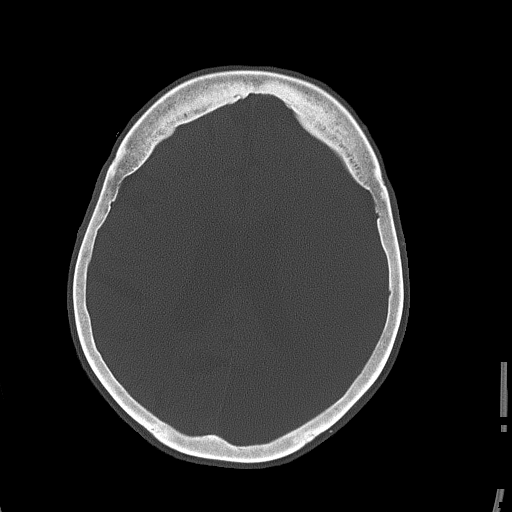
[im 49/75  brain]
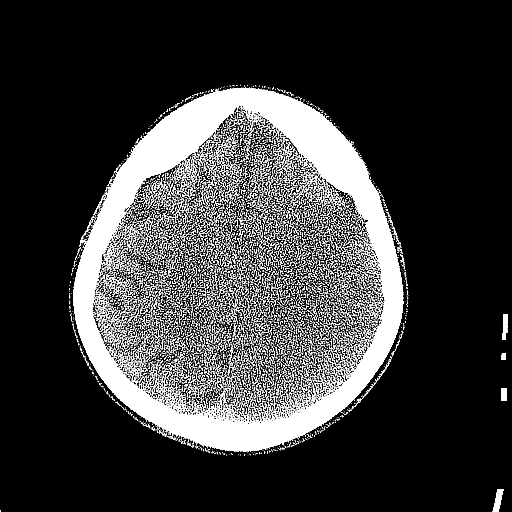
[im 52/75  brain]
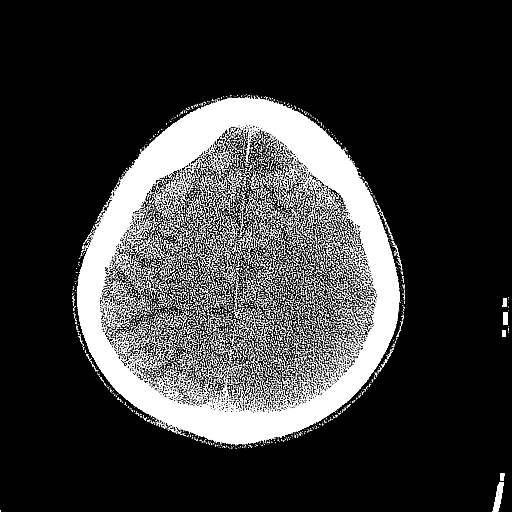
[im 56/75  brain]
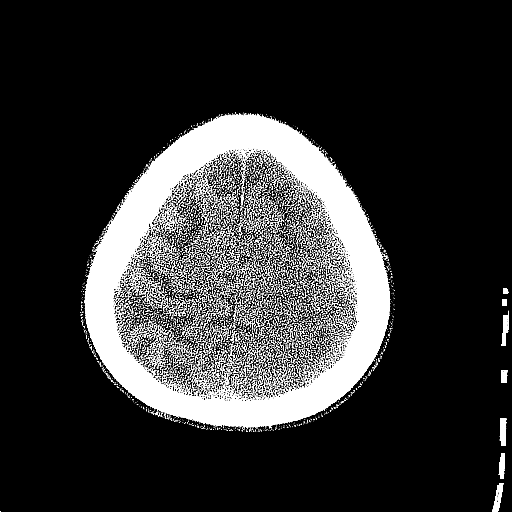
[im 60/75  brain]
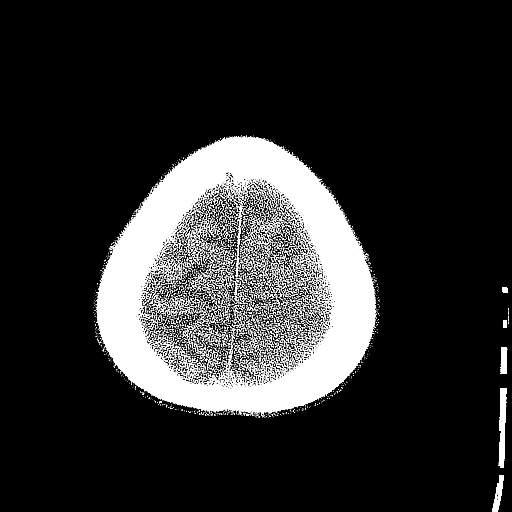
[im 60/75  bone]
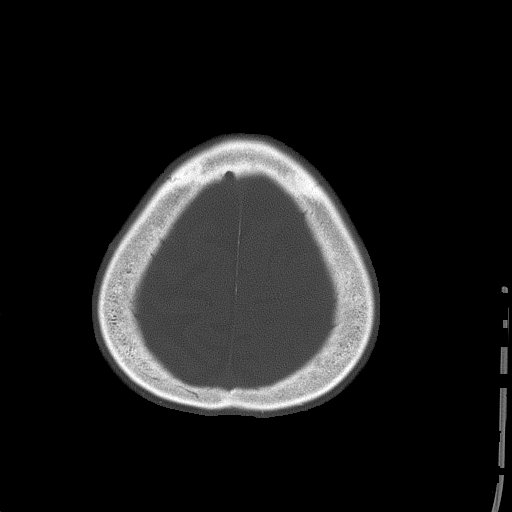
[im 67/75  brain]
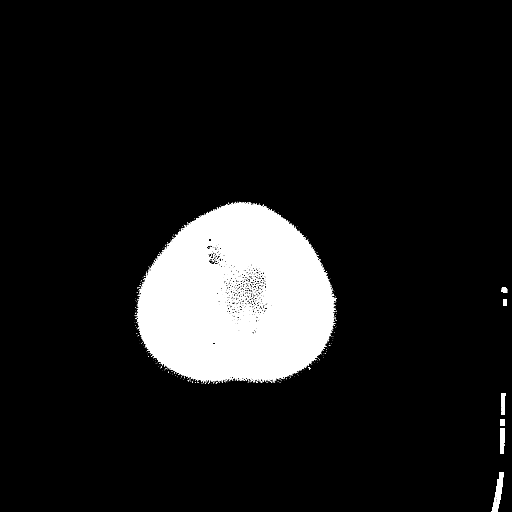
[im 71/75  brain]
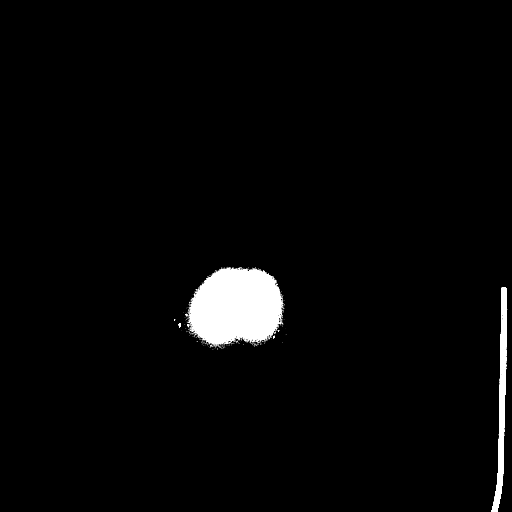

[15 of 30 positions shown; findings below may reference images not displayed]

FINDINGS: Blurring of the LEFT frontal, temporal, parietal and occipital
gray-white matter junction. Patchy hypodensity and LEFT basal
ganglia, new. Associated mild sulcal effacement without midline
shift. Moderate ventriculomegaly, slight mass effect on the LEFT
occipital horn without entrapment. Moderate white matter changes
exclusive of the aforementioned acute ischemia consistent with
chronic small vessel ischemic disease. No intraparenchymal
hemorrhage.

No abnormal extra-axial fluid collections. Basal cisterns are
patent. Dense basilar tip and, dense LEFT MCA. Moderate to severe
calcific atherosclerosis the carotid siphons and included vertebral
arteries.

No skull fracture. The included ocular globes and orbital contents
are non-suspicious. Bilateral ocular lens implants. The mastoid
aircells and included paranasal sinuses are well-aerated.
IMPRESSION: Acute large LEFT middle cerebral artery territory infarct, dense
LEFT MCA.

Acute large LEFT posterior cerebral artery territory infarct, dense
basilar tip suspicious for thrombosis.

By: Shakir Petro
